# Patient Record
Sex: Female | Born: 1955 | Race: Black or African American | Hispanic: No | Marital: Single | Smoking: Never smoker
Health system: Southern US, Community
[De-identification: ages and names within clinical notes are randomized; demographics above are authoritative.]

## PROBLEM LIST (undated history)

## (undated) DIAGNOSIS — I10 Essential (primary) hypertension: Secondary | ICD-10-CM

## (undated) HISTORY — PX: COLONOSCOPY: SHX174

## (undated) HISTORY — DX: Essential (primary) hypertension: I10

---

## 1997-06-04 ENCOUNTER — Other Ambulatory Visit: Admission: RE | Admit: 1997-06-04 | Discharge: 1997-06-04 | Payer: Self-pay | Admitting: Obstetrics and Gynecology

## 1998-05-25 ENCOUNTER — Ambulatory Visit (HOSPITAL_COMMUNITY): Admission: RE | Admit: 1998-05-25 | Discharge: 1998-05-25 | Payer: Self-pay | Admitting: *Deleted

## 1998-05-25 ENCOUNTER — Encounter: Payer: Self-pay | Admitting: *Deleted

## 1998-11-19 ENCOUNTER — Other Ambulatory Visit: Admission: RE | Admit: 1998-11-19 | Discharge: 1998-11-19 | Payer: Self-pay | Admitting: Obstetrics and Gynecology

## 1999-01-28 ENCOUNTER — Encounter: Admission: RE | Admit: 1999-01-28 | Discharge: 1999-02-11 | Payer: Self-pay | Admitting: Internal Medicine

## 2000-04-06 ENCOUNTER — Other Ambulatory Visit: Admission: RE | Admit: 2000-04-06 | Discharge: 2000-04-06 | Payer: Self-pay | Admitting: Obstetrics and Gynecology

## 2001-05-17 ENCOUNTER — Other Ambulatory Visit: Admission: RE | Admit: 2001-05-17 | Discharge: 2001-05-17 | Payer: Self-pay | Admitting: Obstetrics and Gynecology

## 2001-10-04 ENCOUNTER — Encounter: Payer: Self-pay | Admitting: Family Medicine

## 2001-10-04 ENCOUNTER — Encounter: Admission: RE | Admit: 2001-10-04 | Discharge: 2001-10-04 | Payer: Self-pay | Admitting: Family Medicine

## 2002-05-30 ENCOUNTER — Other Ambulatory Visit: Admission: RE | Admit: 2002-05-30 | Discharge: 2002-05-30 | Payer: Self-pay | Admitting: Obstetrics and Gynecology

## 2011-07-31 ENCOUNTER — Other Ambulatory Visit: Payer: Self-pay | Admitting: Internal Medicine

## 2011-07-31 DIAGNOSIS — M545 Low back pain: Secondary | ICD-10-CM

## 2011-08-04 ENCOUNTER — Ambulatory Visit
Admission: RE | Admit: 2011-08-04 | Discharge: 2011-08-04 | Disposition: A | Discharge status: Home / Self Care | Payer: BC Managed Care – PPO | Source: Ambulatory Visit | Attending: Internal Medicine | Admitting: Internal Medicine

## 2011-08-04 DIAGNOSIS — M545 Low back pain: Secondary | ICD-10-CM

## 2011-08-14 ENCOUNTER — Other Ambulatory Visit: Payer: Self-pay | Admitting: Internal Medicine

## 2011-08-14 DIAGNOSIS — M5410 Radiculopathy, site unspecified: Secondary | ICD-10-CM

## 2011-08-21 ENCOUNTER — Ambulatory Visit
Admission: RE | Admit: 2011-08-21 | Discharge: 2011-08-21 | Disposition: A | Discharge status: Home / Self Care | Payer: BC Managed Care – PPO | Source: Ambulatory Visit | Attending: Internal Medicine | Admitting: Internal Medicine

## 2011-08-21 VITALS — BP 181/92 | HR 72 | Ht 64.0 in | Wt 200.0 lb

## 2011-08-21 DIAGNOSIS — M5410 Radiculopathy, site unspecified: Secondary | ICD-10-CM

## 2011-08-21 MED ORDER — IOHEXOL 180 MG/ML  SOLN
1.0000 mL | Freq: Once | INTRAMUSCULAR | Status: AC | PRN
  Administered 2011-08-21: 1 mL via EPIDURAL

## 2011-08-21 MED ORDER — METHYLPREDNISOLONE ACETATE 40 MG/ML INJ SUSP (RADIOLOG
120.0000 mg | Freq: Once | INTRAMUSCULAR | Status: AC
  Administered 2011-08-21: 120 mg via EPIDURAL

## 2011-09-14 ENCOUNTER — Other Ambulatory Visit: Payer: Self-pay | Admitting: Internal Medicine

## 2011-09-14 DIAGNOSIS — IMO0002 Reserved for concepts with insufficient information to code with codable children: Secondary | ICD-10-CM

## 2011-09-25 ENCOUNTER — Ambulatory Visit
Admission: RE | Admit: 2011-09-25 | Discharge: 2011-09-25 | Disposition: A | Discharge status: Home / Self Care | Payer: BC Managed Care – PPO | Source: Ambulatory Visit | Attending: Internal Medicine | Admitting: Internal Medicine

## 2011-09-25 VITALS — BP 163/89 | HR 63

## 2011-09-25 DIAGNOSIS — IMO0002 Reserved for concepts with insufficient information to code with codable children: Secondary | ICD-10-CM

## 2011-09-25 MED ORDER — METHYLPREDNISOLONE ACETATE 40 MG/ML INJ SUSP (RADIOLOG
120.0000 mg | Freq: Once | INTRAMUSCULAR | Status: AC
  Administered 2011-09-25: 120 mg via EPIDURAL

## 2011-09-25 MED ORDER — IOHEXOL 180 MG/ML  SOLN
1.0000 mL | Freq: Once | INTRAMUSCULAR | Status: AC | PRN
  Administered 2011-09-25: 1 mL via EPIDURAL

## 2012-02-09 ENCOUNTER — Encounter: Payer: Self-pay | Admitting: Gastroenterology

## 2012-03-04 ENCOUNTER — Ambulatory Visit (AMBULATORY_SURGERY_CENTER): Payer: BC Managed Care – PPO

## 2012-03-04 ENCOUNTER — Encounter: Payer: Self-pay | Admitting: Gastroenterology

## 2012-03-04 VITALS — Ht 64.0 in | Wt 206.0 lb

## 2012-03-04 DIAGNOSIS — Z1211 Encounter for screening for malignant neoplasm of colon: Secondary | ICD-10-CM

## 2012-03-04 MED ORDER — MOVIPREP 100 G PO SOLR: 1.0000 | Freq: Once | ORAL | Status: DC

## 2012-03-18 ENCOUNTER — Encounter: Payer: Self-pay | Admitting: Gastroenterology

## 2012-03-18 ENCOUNTER — Ambulatory Visit (AMBULATORY_SURGERY_CENTER): Payer: BC Managed Care – PPO | Admitting: Gastroenterology

## 2012-03-18 VITALS — BP 144/80 | HR 86 | Temp 97.6°F | Resp 27 | Ht 64.0 in | Wt 206.0 lb

## 2012-03-18 DIAGNOSIS — D126 Benign neoplasm of colon, unspecified: Secondary | ICD-10-CM

## 2012-03-18 DIAGNOSIS — Z1211 Encounter for screening for malignant neoplasm of colon: Secondary | ICD-10-CM

## 2012-03-18 MED ORDER — SODIUM CHLORIDE 0.9 % IV SOLN: 500.0000 mL | INTRAVENOUS | Status: DC

## 2012-03-18 NOTE — Progress Notes (Signed)
Called to room to assist during endoscopic procedure.  Patient ID and intended procedure confirmed with present staff. Received instructions for my participation in the procedure from the performing physician.  

## 2012-03-18 NOTE — Op Note (Signed)
[facility] [address] [place], [address]   COLONOSCOPY PROCEDURE REPORT PATIENT: Carla Owens, Carla Owens  MR#: [id number] BIRTHDATE: 1955-07-10 , 56  yrs. old GENDER: Female ENDOSCOPIST: Meryl Dare, MD, [facility] REFERRED XB:JYNWG Nicholos Johns, M.D. PROCEDURE DATE:  03/18/2012 PROCEDURE:   Colonoscopy with biopsy ASA CLASS:   Class II INDICATIONS:average risk screening. MEDICATIONS: MAC sedation, administered by CRNA and propofol (Diprivan) 230mg  IV DESCRIPTION OF PROCEDURE:   After the risks benefits and alternatives of the procedure were thoroughly explained, informed consent was obtained.  A digital rectal exam revealed no abnormalities of the rectum.   The LB [id number]  endoscope was introduced through the anus and advanced to the cecum, which was identified by both the appendix and ileocecal valve. No adverse events experienced.   The quality of the prep was excellent, using MoviPrep  The instrument was then slowly withdrawn as the colon was fully examined.  COLON FINDINGS: A sessile polyp measuring 4 mm in size was found in the transverse colon.  A polypectomy was performed with cold forceps.  The resection was complete and the polyp tissue was completely retrieved.   A sessile polyp measuring 5 mm in size was found in the descending colon.  A polypectomy was performed with cold forceps.  The resection was complete and the polyp tissue was completely retrieved.   Two sessile polyps measuring 5 mm in size were found in the sigmoid colon.  A polypectomy was performed with cold forceps.  The resection was complete and the polyp tissue was completely retrieved.   The colon was otherwise normal.  There was no diverticulosis, inflammation, polyps or cancers unless previously stated.  Retroflexed views revealed internal hemorrhoids. The time to cecum=1 minutes 20 seconds.  Withdrawal time=9 minutes 21 seconds.  The scope was withdrawn and the procedure  completed. COMPLICATIONS: There were no complications.  ENDOSCOPIC IMPRESSION: 1.   Sessile polyp measuring 4 mm in the transverse colon; polypectomy performed with cold forceps 2.   Sessile polyp measuring 5 mm in the descending colon; polypectomy performed with cold forceps 3.   Two sessile polyps measuring 5 mm in the sigmoid colon; polypectomy performed with cold forceps 4.   Small internal hemorrhoids  RECOMMENDATIONS: 1.  Await pathology results 2.  Repeat colonoscopy in 5 years if polyp(s) adenomatous; otherwise 10 years  eSigned:  Meryl Dare, MD, [facility] 03/18/2012 11:17 AM

## 2012-03-18 NOTE — Progress Notes (Signed)
Patient did not experience any of the following events: a burn prior to discharge; a fall within the facility; wrong site/side/patient/procedure/implant event; or a hospital transfer or hospital admission upon discharge from the facility. (G8907) Patient did not have preoperative order for IV antibiotic SSI prophylaxis. (G8918)  

## 2012-03-18 NOTE — Patient Instructions (Addendum)
Impressions/recommendations:  Multiple polyps (handout given) Hemorrhoids (handout given)  Repeat colonoscopy dependent upon pathology results  YOU HAD AN ENDOSCOPIC PROCEDURE TODAY AT THE [facility]: Refer to the procedure report that was given to you for any specific questions about what was found during the examination.  If the procedure report does not answer your questions, please call your gastroenterologist to clarify.  If you requested that your care partner not be given the details of your procedure findings, then the procedure report has been included in a sealed envelope for you to review at your convenience later.  YOU SHOULD EXPECT: Some feelings of bloating in the abdomen. Passage of more gas than usual.  Walking can help get rid of the air that was put into your GI tract during the procedure and reduce the bloating. If you had a lower endoscopy (such as a colonoscopy or flexible sigmoidoscopy) you may notice spotting of blood in your stool or on the toilet paper. If you underwent a bowel prep for your procedure, then you may not have a normal bowel movement for a few days.  DIET: Your first meal following the procedure should be a light meal and then it is ok to progress to your normal diet.  A half-sandwich or bowl of soup is an example of a good first meal.  Heavy or fried foods are harder to digest and may make you feel nauseous or bloated.  Likewise meals heavy in dairy and vegetables can cause extra gas to form and this can also increase the bloating.  Drink plenty of fluids but you should avoid alcoholic beverages for 24 hours.  ACTIVITY: Your care partner should take you home directly after the procedure.  You should plan to take it easy, moving slowly for the rest of the day.  You can resume normal activity the day after the procedure however you should NOT DRIVE or use heavy machinery for 24 hours (because of the sedation medicines used during the test).     SYMPTOMS TO REPORT IMMEDIATELY: A gastroenterologist can be reached at any hour.  During normal business hours, 8:30 AM to 5:00 PM Monday through Friday, call [phone].  After hours and on weekends, please call the GI answering service at [phone] who will take a message and have the physician on call contact you.   Following lower endoscopy (colonoscopy or flexible sigmoidoscopy):  Excessive amounts of blood in the stool  Significant tenderness or worsening of abdominal pains  Swelling of the abdomen that is new, acute  Fever of 100F or higher  FOLLOW UP: If any biopsies were taken you will be contacted by phone or by letter within the next 1-3 weeks.  Call your gastroenterologist if you have not heard about the biopsies in 3 weeks.  Our staff will call the home number listed on your records the next business day following your procedure to check on you and address any questions or concerns that you may have at that time regarding the information given to you following your procedure. This is a courtesy call and so if there is no answer at the home number and we have not heard from you through the emergency physician on call, we will assume that you have returned to your regular daily activities without incident.  SIGNATURES/CONFIDENTIALITY: You and/or your care partner have signed paperwork which will be entered into your electronic medical record.  These signatures attest to the fact that that the information above on your  After Visit Summary has been reviewed and is understood.  Full responsibility of the confidentiality of this discharge information lies with you and/or your care-partner.

## 2012-03-21 ENCOUNTER — Telehealth: Payer: Self-pay | Admitting: *Deleted

## 2012-03-21 NOTE — Telephone Encounter (Signed)
Left message on number given in admitting to return call if problems or questions. ewm

## 2012-03-23 ENCOUNTER — Encounter: Payer: Self-pay | Admitting: Gastroenterology

## 2015-11-05 ENCOUNTER — Ambulatory Visit (INDEPENDENT_AMBULATORY_CARE_PROVIDER_SITE_OTHER): Payer: BC Managed Care – PPO | Admitting: Podiatry

## 2015-11-05 ENCOUNTER — Other Ambulatory Visit: Payer: Self-pay | Admitting: *Deleted

## 2015-11-05 ENCOUNTER — Ambulatory Visit (INDEPENDENT_AMBULATORY_CARE_PROVIDER_SITE_OTHER): Payer: BC Managed Care – PPO

## 2015-11-05 ENCOUNTER — Encounter: Payer: Self-pay | Admitting: Podiatry

## 2015-11-05 ENCOUNTER — Ambulatory Visit: Payer: Self-pay

## 2015-11-05 DIAGNOSIS — M205X2 Other deformities of toe(s) (acquired), left foot: Secondary | ICD-10-CM

## 2015-11-05 DIAGNOSIS — L600 Ingrowing nail: Secondary | ICD-10-CM

## 2015-11-05 DIAGNOSIS — M722 Plantar fascial fibromatosis: Secondary | ICD-10-CM

## 2015-11-05 MED ORDER — METHYLPREDNISOLONE 4 MG PO TBPK: ORAL_TABLET | ORAL | 0 refills | Status: DC

## 2015-11-05 NOTE — Progress Notes (Signed)
   Subjective:    Patient ID: Carla Owens, female    DOB: Dec 07, 1955, 60 y.o.   MRN: [id number]  HPI: She presents today with chief complaint of plantar heels bilaterally right greater than left aching for the past several months mornings are particularly bad and she hasn't had treatment for these further past 4 years. She states that they recently started bothering her. She's also noted that she has a mallet toe deformity resulting in a corn to the distal aspect overlying the top of the toe she states is tender with shoes and she tried using Lott is a corn remover which took the corner way but also discolored her toe. She is also considering that she has ingrown toenails to the hallux bilaterally where she had matrixectomy performed but she still has to trim on them she says.    Review of Systems  Musculoskeletal: Positive for back pain and myalgias.  All other systems reviewed and are negative.      Objective:   Physical Exam: Vital signs are stable she is alert and oriented 3. Pulses are palpable. Neurologic sensorium is intact. Deep tendon reflexes are intact. Muscle strength is intact and symmetrical bilateral. Orthopedic evaluation and stress rectus foot type bilateral. She has pain on palpation medial calcaneal tubercles bilaterally. Mallet toe deformity second left greater than that of the right and is flexible in nature. Cutaneous evaluation demonstrates normal supple well-hydrated cutis she does have slight reactive hyperkeratotic lesion overlying the DIPJ second digit left foot and she does have some sharp incurvated nail margins to the tibiofibular hallux bilateral.        Assessment & Plan:  Plantar fasciitis bilateral. Ingrown toenails hallux bilateral. 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  Plan: I injected the bilateral heels and put her in bilateral plantar fascia braces today she has her night splint at home which she will utilize. We discussed appropriate shoe gear  stretching exercises and ice therapy. Start her on a Medrol Dosepak. Provided her with icing and stretching instructions will follow up with her in 1 month.

## 2015-11-05 NOTE — Patient Instructions (Signed)

## 2015-12-03 ENCOUNTER — Ambulatory Visit: Payer: BC Managed Care – PPO | Admitting: Podiatry

## 2015-12-05 ENCOUNTER — Ambulatory Visit: Payer: BC Managed Care – PPO | Admitting: Podiatry

## 2015-12-17 ENCOUNTER — Encounter: Payer: Self-pay | Admitting: Podiatry

## 2015-12-17 ENCOUNTER — Ambulatory Visit (INDEPENDENT_AMBULATORY_CARE_PROVIDER_SITE_OTHER): Payer: BC Managed Care – PPO | Admitting: Podiatry

## 2015-12-17 DIAGNOSIS — L600 Ingrowing nail: Secondary | ICD-10-CM | POA: Diagnosis not present

## 2015-12-17 MED ORDER — NEOMYCIN-POLYMYXIN-HC 1 % OT SOLN: OTIC | 1 refills | Status: DC

## 2015-12-17 NOTE — Progress Notes (Signed)
She presents today for follow-up of her bilateral plantar fasciitis that she is a proximally 70-75% improved. She would like to have her nail procedures performed today to both ingrown nails of the hallux bilaterally.  Objective: Vital signs are stable alert and oriented 3 pulses are palpable. Neurologic sensorium is intact. Much decrease in pain on palpation medial calcaneal tubercles bilateral. Sharp incurvated nail margins along the tibiofibular border of the hallux bilaterally left greater than right the left great toe has been corrected before.  Assessment: Resolving plantar fasciitis 70-75% improved. Ingrown toenails tibial and fibular border of the hallux bilateral.  Plan: Chemical matrixectomy was performed to the hallux bilateral today after local anesthesia was administered. She tolerated the procedure well and was provided with both oral and written home going instructions for care and soaking of her toe as well as a prescription for Cortisporin Otic to be applied twice daily after soaking. I will follow-up with her in 1 week or so at which time we may consider reinjecting her bilateral heels of the worsened. I will follow-up with her in 1-2 weeks or she will call with questions or concerns.

## 2015-12-17 NOTE — Patient Instructions (Signed)

## 2015-12-31 ENCOUNTER — Ambulatory Visit (INDEPENDENT_AMBULATORY_CARE_PROVIDER_SITE_OTHER): Payer: BC Managed Care – PPO | Admitting: Podiatry

## 2015-12-31 ENCOUNTER — Encounter: Payer: Self-pay | Admitting: Podiatry

## 2015-12-31 DIAGNOSIS — L600 Ingrowing nail: Secondary | ICD-10-CM | POA: Diagnosis not present

## 2015-12-31 NOTE — Patient Instructions (Signed)

## 2016-01-01 NOTE — Progress Notes (Signed)
She presents today for follow-up of matrixectomy to the hallux bilateral. She states that she continues to soak in Epsom salts and warm water and the toe seemed to be doing pretty well. She is also concerned about pain to the distal aspect of the second digit left foot. She states it is painful and she would like to consider having this surgically fixed.  Objective: Vital signs are stable she is alert and oriented 3. Pulses are palpable. Neurologic sensorium is intact. Degenerative flexors are intact. Surgical toe appears to be healing quite nicely tibial and fibular borders. No granulation tissue noted. No malodor. She has a rigid DIPJ flexor contraction second digit left foot with overlying reactive hyperkeratosis. Radiographs reviewed and demonstrate mallet toe deformity.  Assessment: Well-healing surgical toes hallux bilateral. 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  Plan: I encouraged her to continue to soak Epsom salts and warm water until all the redness and tenderness has resolved. We also went over a consent form today  Number number given time to ask questions she soft. Regarding a DIPJ arthroplasty/mallet toe repair second digit left foot. I answered all the questions regarding this procedure is the best viability in layman's terms. We did discuss the possible postop complications which may include but are not limited to postop pain bleeding swelling infection recurrence and need for further surgery over correction and correction. Follow up with me in the next few weeks for surgical intervention.

## 2016-02-06 ENCOUNTER — Telehealth: Payer: Self-pay | Admitting: *Deleted

## 2016-02-06 NOTE — Telephone Encounter (Signed)
"  I received an email from the surgical center but I lost it.  Can you send it to me again?"  I do not know anything about it, you will have to call the surgical center.  Their number is [phone].

## 2016-03-05 ENCOUNTER — Telehealth: Payer: Self-pay | Admitting: *Deleted

## 2016-03-05 NOTE — Telephone Encounter (Signed)
"  I am scheduled for surgery on February 23.  Will my surgery be in the morning?  I haven't received a call about the time yet."  Your surgery will be in the morning.  "Can you give me a time because my daughter is taking off work to take me and she wants to schedule an appointment for herself as well that day while she's off."  I can't give you an exact time but I can tell you it will be that morning so if she wants to schedule she can do it that afternoon.  The reason why I can't give you a time is because there may be cancellations or a diabetic patient or child needs to go first so they switch people around.  "Oh okay, knowing that it will be that morning helps.  I will let my daughter know."

## 2016-03-19 ENCOUNTER — Other Ambulatory Visit: Payer: Self-pay | Admitting: Podiatry

## 2016-03-19 MED ORDER — CEPHALEXIN 500 MG PO CAPS: 500.0000 mg | ORAL_CAPSULE | Freq: Three times a day (TID) | ORAL | 0 refills | Status: DC

## 2016-03-19 MED ORDER — PROMETHAZINE HCL 25 MG PO TABS: 25.0000 mg | ORAL_TABLET | Freq: Three times a day (TID) | ORAL | 0 refills | Status: DC | PRN

## 2016-03-19 MED ORDER — OXYCODONE-ACETAMINOPHEN 10-325 MG PO TABS: 1.0000 | ORAL_TABLET | Freq: Four times a day (QID) | ORAL | 0 refills | Status: DC | PRN

## 2016-03-20 ENCOUNTER — Encounter: Payer: Self-pay | Admitting: Podiatry

## 2016-03-20 DIAGNOSIS — M2042 Other hammer toe(s) (acquired), left foot: Secondary | ICD-10-CM | POA: Diagnosis not present

## 2016-03-23 ENCOUNTER — Telehealth: Payer: Self-pay | Admitting: *Deleted

## 2016-03-23 NOTE — Telephone Encounter (Addendum)
Pt states she needs a note stating she is to be out of work from 03/20/2016 - 04/03/2016, and she would like to go back to work 1/2 days next week. I told pt is would need to inform Dr. Milinda Pointer for his approval. 03/25/2016-I spoke with pt and informed Dr.Hyatt had okayed her request to return to 1/2 days at work on 03/30/2016 - 04/03/2016, and to see how she feels prior to returning to work full time and if any restrictions. Pt states she would like to have the letter faxed to [phone] Attn: Dr. David Stall, and Dr. Maudie Mercury Sexton-Lewter. Faxed letter to Dr. Weldon Picking, and Dr. Jodell Cipro.

## 2016-03-23 NOTE — Telephone Encounter (Signed)
"  Can you call me back?  I had my surgery Friday, February 23 with Dr. Milinda Pointer.  I forgot to get a doctor's note for me being out of work.  I just need your help in making sure that my director gets a doctor"s note.  I want to talk to you about getting a doctor's note for my job.  Thank you."  I am returning your call how can I help you?  "I had surgery on Friday.  I'm glad I listened to you and took 10 days off.  After that numbness wore off I felt it.  I been keeping it elevated and icing it like you told me.  I forgot to get a note for work.  Can you send a letter to my job?"  I am glad you are doing well.  You need to speak to [place].  She takes care of FMLA / Short term disability.  She is not available at this moment but I will get her to give you a call.

## 2016-03-25 ENCOUNTER — Encounter: Payer: Self-pay | Admitting: *Deleted

## 2016-03-25 NOTE — Telephone Encounter (Signed)
That should be fine. 

## 2016-03-26 ENCOUNTER — Ambulatory Visit (INDEPENDENT_AMBULATORY_CARE_PROVIDER_SITE_OTHER): Payer: Self-pay | Admitting: Podiatry

## 2016-03-26 ENCOUNTER — Ambulatory Visit (INDEPENDENT_AMBULATORY_CARE_PROVIDER_SITE_OTHER): Payer: BC Managed Care – PPO

## 2016-03-26 VITALS — BP 103/66 | HR 83 | Temp 97.0°F

## 2016-03-26 DIAGNOSIS — M205X2 Other deformities of toe(s) (acquired), left foot: Secondary | ICD-10-CM

## 2016-03-26 DIAGNOSIS — Z9889 Other specified postprocedural states: Secondary | ICD-10-CM

## 2016-03-26 NOTE — Progress Notes (Signed)
She presents today for her first postop visit she is status post mallet toe repair DIPJ arthroplasty second digit left foot denies fever chills nausea vomiting muscle aches and pains.  Objective: Vital signs are stable alert and oriented 3. Pulses are palpable. Neurologic sensorium is intact. Degenerative flexor intact. Dry sterile dressing intact was removed demonstrates rectus toe sutures are intact towards a well coapted radiographs demonstrate complete arthroplasty the DIPJ.  Assessment: Well-healing surgical foot second digit left foot.  Plan: Dry sterile compressive dressing was applied today she will continue to keep this dry and utilize her Darco shoe I will follow-up with her in 1 week.

## 2016-04-02 ENCOUNTER — Ambulatory Visit: Payer: BC Managed Care – PPO | Admitting: Podiatry

## 2016-04-02 ENCOUNTER — Ambulatory Visit (INDEPENDENT_AMBULATORY_CARE_PROVIDER_SITE_OTHER): Payer: BC Managed Care – PPO | Admitting: Podiatry

## 2016-04-02 DIAGNOSIS — M205X2 Other deformities of toe(s) (acquired), left foot: Secondary | ICD-10-CM

## 2016-04-06 NOTE — Progress Notes (Signed)
She presents today for a suture removal is post mallet toe repair second digit left foot date of surgery 03/20/2016. States that she's been doing very well and complications.  Objective: Dry sterile dressing attack was removed demonstrates sutures are intact margins well coapted and sutures were removed today margins remaining well with no signs of infection or swelling toe was in good position.  Assessment: Well healing surgical toe hallux left.  Plan: Sutures removed today encourage her to continue to wrap the toe with coban which I did demonstrate to her. I will follow-up with her in 2 weeks.

## 2016-04-21 ENCOUNTER — Ambulatory Visit (INDEPENDENT_AMBULATORY_CARE_PROVIDER_SITE_OTHER): Payer: BC Managed Care – PPO

## 2016-04-21 ENCOUNTER — Ambulatory Visit (INDEPENDENT_AMBULATORY_CARE_PROVIDER_SITE_OTHER): Payer: BC Managed Care – PPO | Admitting: Podiatry

## 2016-04-21 ENCOUNTER — Encounter: Payer: Self-pay | Admitting: Podiatry

## 2016-04-21 DIAGNOSIS — M779 Enthesopathy, unspecified: Secondary | ICD-10-CM

## 2016-04-21 DIAGNOSIS — M205X2 Other deformities of toe(s) (acquired), left foot: Secondary | ICD-10-CM

## 2016-04-21 DIAGNOSIS — M7752 Other enthesopathy of left foot: Secondary | ICD-10-CM

## 2016-04-21 DIAGNOSIS — M778 Other enthesopathies, not elsewhere classified: Secondary | ICD-10-CM

## 2016-04-21 MED ORDER — METHYLPREDNISOLONE 4 MG PO TBPK: ORAL_TABLET | ORAL | 0 refills | Status: DC

## 2016-04-22 NOTE — Progress Notes (Signed)
She presents today 1 month status post mallet toe repair second digit left foot. She states it is hurting all the way up to my leg and she refers to pain from the second metatarsal phalangeal joint the radiates of the anterior and then to the lateral aspect of the leg and in the posterior aspect of her thigh. She does relate a chronic history of sciatica.  Objective: Vital signs are stable she is alert and oriented 3. Pulses are palpable. Neurologic sensorium is intact. Degenerative flexors are intact. She has no pain on palpation of the surgical DIPJ arthroplasty however she does have some tenderness on deep palpation of the second metatarsophalangeal joint but not excruciating. She has more tenderness when she attempts to raise her leg or rotate the leg itself she states that it feels like it starts from second knuckle and radiates proximally. I see no signs of infection radiographs taken today demonstrate complete arthroplasty.  Assessment: Capsulitis and sciatica left.  Plan: I injected periarticular today Kenalog and local anesthetic. I gave the anesthetic time to take hold but the patient continued to have pain with leg range of motion. She had no pain on palpation of the second metatarsophalangeal joint at that time. This is consistent with proximal nerve injury or pain. I also recommended that she discontinue use of the Darco shoe and an attempt to level or back also going to start her on a Medrol Dosepak.

## 2016-05-12 ENCOUNTER — Ambulatory Visit (INDEPENDENT_AMBULATORY_CARE_PROVIDER_SITE_OTHER): Payer: Self-pay | Admitting: Podiatry

## 2016-05-12 ENCOUNTER — Ambulatory Visit (INDEPENDENT_AMBULATORY_CARE_PROVIDER_SITE_OTHER): Payer: BC Managed Care – PPO

## 2016-05-12 DIAGNOSIS — M205X2 Other deformities of toe(s) (acquired), left foot: Secondary | ICD-10-CM | POA: Diagnosis not present

## 2016-05-12 NOTE — Progress Notes (Signed)
She presents today for her final postop visit date of surgery 03/20/2016 mallet toe repair second digit left foot states that is doing great. She states that she still has some sciatic pain and lower back pain this radiating into her leg.  Objective: Pulses are palpable toe appears to be healing uneventfully. There is some scar tissue dorsal and medial at the DIPJ. We'll more than likely go ahead and resolve in the near future. Radiographs taken today demonstrate complete arthroplasty DIPJ second digit left foot no spurring noted.  Assessment: Healing surgical foot.  Plan: Recommended that she continue massage therapy to the second toe. I also recommended that she consult with her primary care provider for a neurosurgery consult.

## 2016-07-02 NOTE — Progress Notes (Signed)
1. Mallet toe repair 2nd toe left foot

## 2016-08-17 ENCOUNTER — Other Ambulatory Visit: Payer: Self-pay | Admitting: Internal Medicine

## 2016-08-17 DIAGNOSIS — M79662 Pain in left lower leg: Secondary | ICD-10-CM

## 2016-08-24 ENCOUNTER — Other Ambulatory Visit: Payer: BC Managed Care – PPO

## 2016-09-03 ENCOUNTER — Ambulatory Visit
Admission: RE | Admit: 2016-09-03 | Discharge: 2016-09-03 | Disposition: A | Discharge status: Home / Self Care | Payer: BC Managed Care – PPO | Source: Ambulatory Visit | Attending: Internal Medicine | Admitting: Internal Medicine

## 2016-09-03 DIAGNOSIS — M79662 Pain in left lower leg: Secondary | ICD-10-CM

## 2018-08-24 ENCOUNTER — Other Ambulatory Visit: Payer: Self-pay

## 2018-08-24 DIAGNOSIS — Z20822 Contact with and (suspected) exposure to covid-19: Secondary | ICD-10-CM

## 2018-08-26 LAB — NOVEL CORONAVIRUS, NAA: SARS-CoV-2, NAA: NOT DETECTED

## 2019-02-06 ENCOUNTER — Other Ambulatory Visit: Payer: Self-pay | Admitting: Cardiology

## 2019-02-06 DIAGNOSIS — Z20822 Contact with and (suspected) exposure to covid-19: Secondary | ICD-10-CM

## 2019-02-08 LAB — NOVEL CORONAVIRUS, NAA: SARS-CoV-2, NAA: NOT DETECTED

## 2019-04-18 ENCOUNTER — Other Ambulatory Visit: Payer: Self-pay | Admitting: Internal Medicine

## 2019-04-18 DIAGNOSIS — M5416 Radiculopathy, lumbar region: Secondary | ICD-10-CM

## 2019-05-19 ENCOUNTER — Ambulatory Visit
Admission: RE | Admit: 2019-05-19 | Discharge: 2019-05-19 | Disposition: A | Discharge status: Home / Self Care | Payer: BC Managed Care – PPO | Source: Ambulatory Visit | Attending: Internal Medicine | Admitting: Internal Medicine

## 2019-05-19 ENCOUNTER — Other Ambulatory Visit: Payer: Self-pay

## 2019-05-19 DIAGNOSIS — M5416 Radiculopathy, lumbar region: Secondary | ICD-10-CM

## 2020-03-22 DIAGNOSIS — E669 Obesity, unspecified: Secondary | ICD-10-CM | POA: Insufficient documentation

## 2020-03-22 DIAGNOSIS — I10 Essential (primary) hypertension: Secondary | ICD-10-CM | POA: Insufficient documentation

## 2020-03-22 DIAGNOSIS — D259 Leiomyoma of uterus, unspecified: Secondary | ICD-10-CM | POA: Insufficient documentation

## 2020-03-22 DIAGNOSIS — A6000 Herpesviral infection of urogenital system, unspecified: Secondary | ICD-10-CM | POA: Insufficient documentation

## 2020-03-22 DIAGNOSIS — G43909 Migraine, unspecified, not intractable, without status migrainosus: Secondary | ICD-10-CM | POA: Insufficient documentation

## 2021-02-08 IMAGING — MR MR LUMBAR SPINE W/O CM
4 of 9 series · 14 of 48 positions shown · non-contrast
Comparison: MRI 08/04/2011

CLINICAL DATA: Chronic low back pain, bilateral leg numbness

EXAM:
MRI LUMBAR SPINE WITHOUT CONTRAST
TECHNIQUE: Multiplanar, multisequence MR imaging of the lumbar spine was
performed. No intravenous contrast was administered.

[Series 5: T2 · sagittal · 4.0mm · 0.73mm/px · 3 of 15 slices shown (1 of 4)]
[im 1/15]
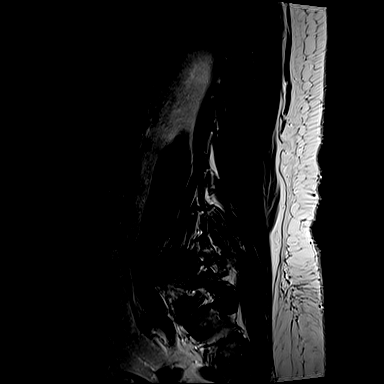
[im 8/15]
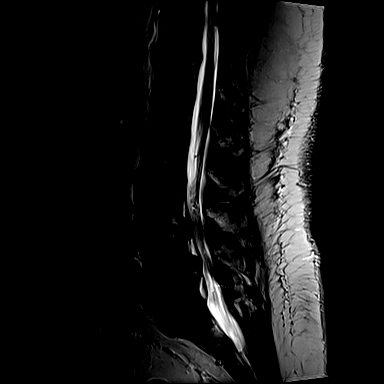
[im 15/15]
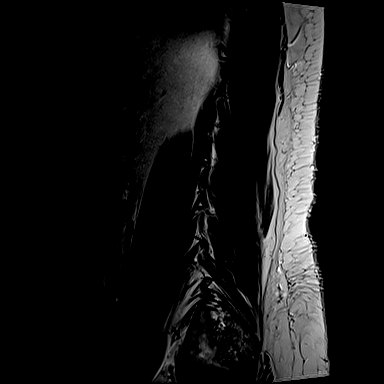

[Series 11: T2 · axial · 4.0mm · 0.28mm/px · z∈[+2,+92]mm · 5 of 19 slices shown (2 of 4)]
[im 1/19]
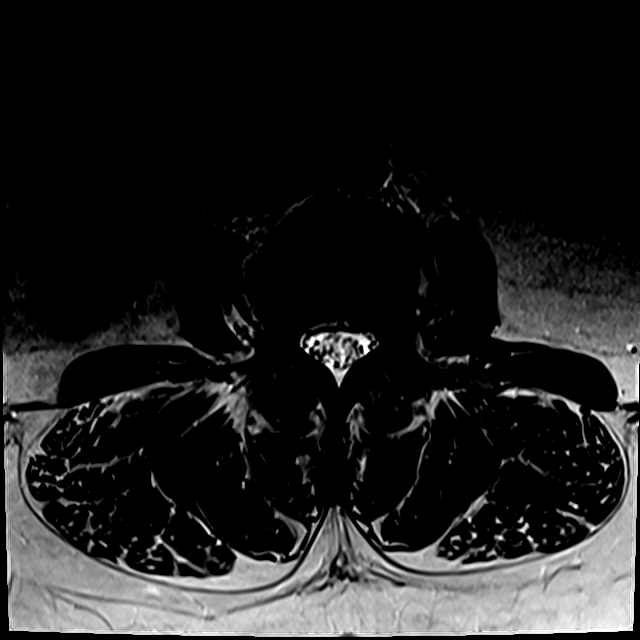
[im 5/19]
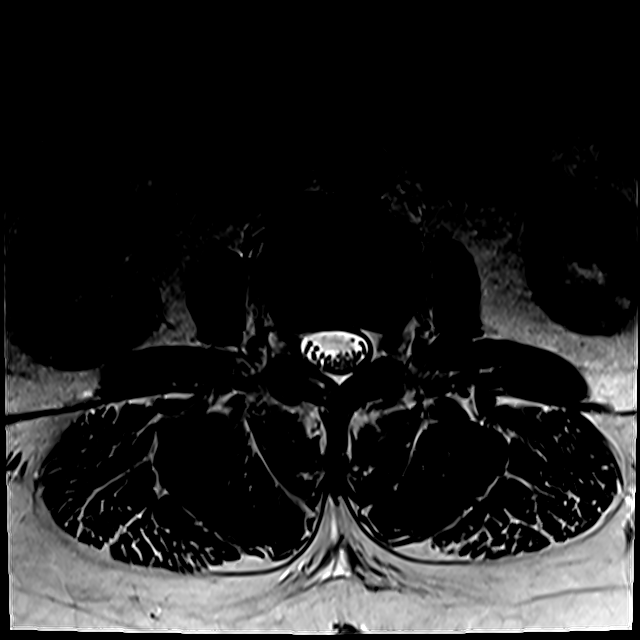
[im 10/19]
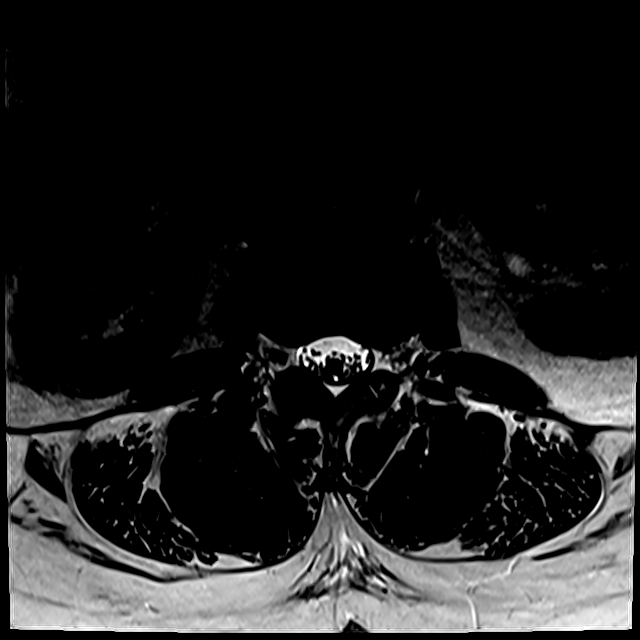
[im 14/19]
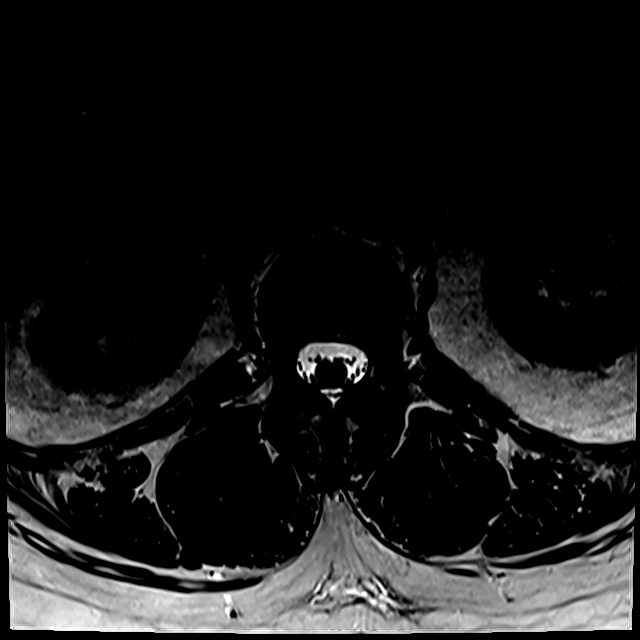
[im 19/19]
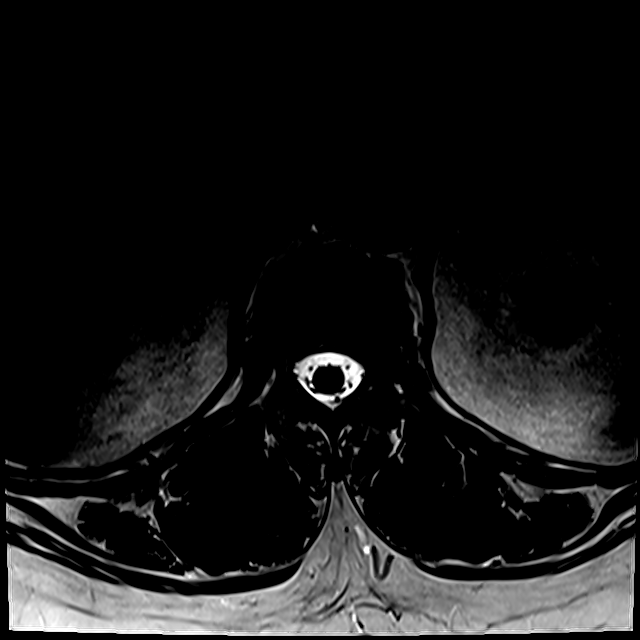

[Series 12: T2 · axial · 4.0mm · 0.28mm/px · z∈[-113,-19]mm · 3 of 20 slices shown (3 of 4)]
[im 1/20]
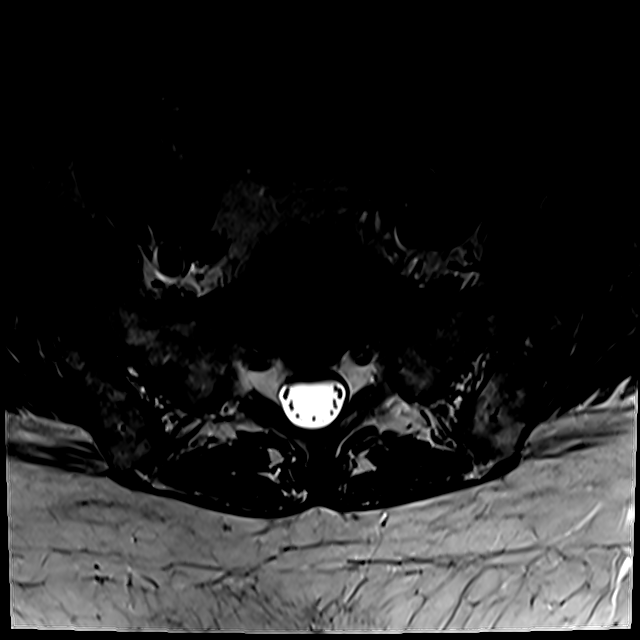
[im 10/20]
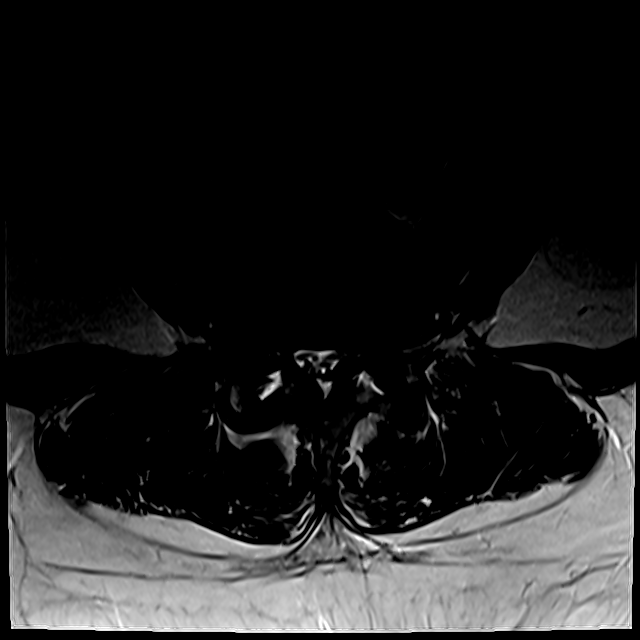
[im 20/20]
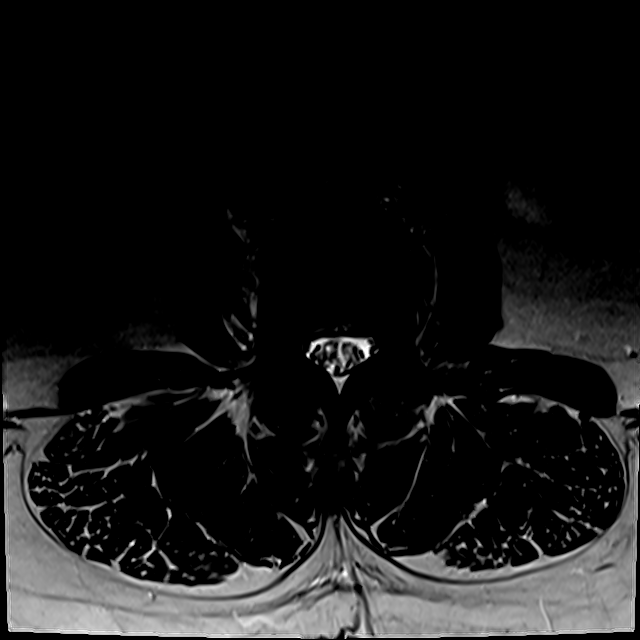

[Series 13: T2 · axial · 4.0mm · 0.28mm/px · z∈[-93,+67]mm · 3 of 39 slices shown (4 of 4)]
[im 5/39]
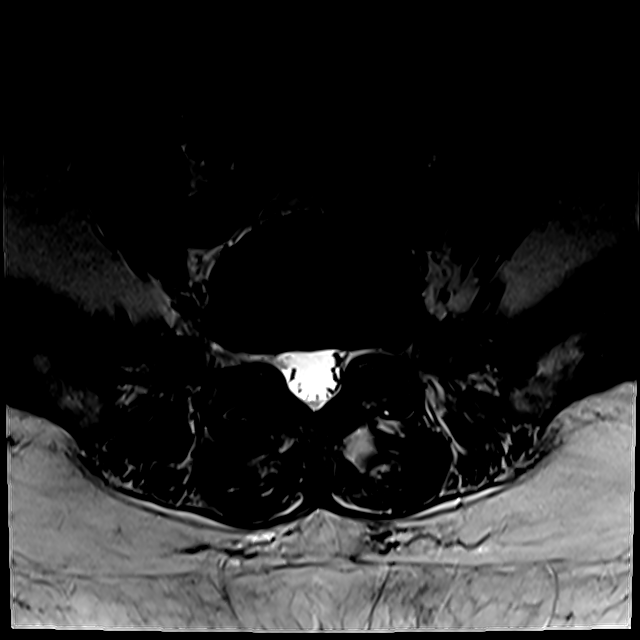
[im 20/39]
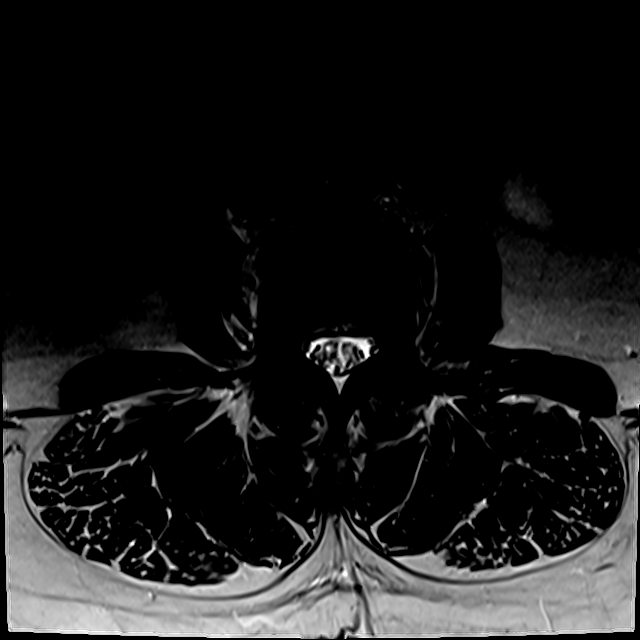
[im 34/39]
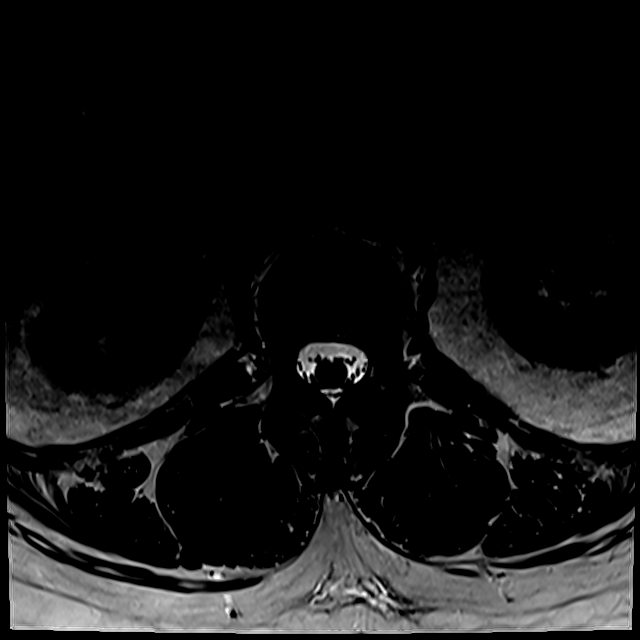

[14 of 48 positions shown; findings below may reference images not displayed]

FINDINGS: Segmentation:  Standard.

Alignment: 5 mm grade 1 anterolisthesis L3 on L4, progressed from
prior. 5 mm anterolisthesis L4 on L5, slightly progressed from
prior.

Vertebrae: No fracture, evidence of discitis, or bone lesion. Mild
discogenic endplate marrow changes including Schmorl's node the L4
superior endplate.

Conus medullaris and cauda equina: Conus extends to the L2 level.
Conus and cauda equina appear normal.

Paraspinal and other soft tissues: Cortically based T2 hyperintense
lesionswithin the bilateral kidneys, incompletely characterized, but
most likely represent cysts.

Disc levels:

T12-L1: No significant disc protrusion, foraminal stenosis, or canal
stenosis.

L1-L2: Mild right greater than left facet arthropathy. Unremarkable
disc. No foraminal or canal stenosis.

L2-L3: Mild bilateral facet arthropathy with ligamentum flavum
buckling. Small left foraminal disc protrusion. No foraminal or
canal stenosis.

L3-L4: Disc uncovering with diffuse disc bulge, moderate bilateral
facet arthrosis, and ligamentum flavum buckling. Findings contribute
to moderate to severe canal stenosis with moderate to severe right
and mild left foraminal stenosis. There is moderate right greater
than left subarticular recess stenosis. Findings have slightly
progressed from prior.

L4-L5: Disc uncovering with diffuse disc bulge, advanced bilateral
facet arthrosis, and ligamentum flavum buckling. Findings contribute
to moderate canal stenosis with moderate to severe bilateral
foraminal stenosis and moderate bilateral subarticular recess
stenosis. Findings slightly progressed from prior.

L5-S1: Minimal diffuse disc bulge and mild bilateral facet
hypertrophy resulting in moderate left and mild right foraminal
stenosis. No canal stenosis. Findings progressed from prior.
IMPRESSION: 1. Multilevel degenerative changes of the lumbar spine as described
above, progressed from prior.
2. Moderate-to-severe L3-4 and moderate L4-L5 canal stenosis.
3. Moderate-to-severe bilateral foraminal stenosis at L4-5 and
moderate-to-severe right foraminal stenosis at L3-4.

## 2021-11-24 ENCOUNTER — Other Ambulatory Visit: Payer: Self-pay | Admitting: Radiology

## 2022-06-23 ENCOUNTER — Encounter: Payer: Self-pay | Admitting: Gastroenterology

## 2022-07-02 ENCOUNTER — Encounter: Payer: Self-pay | Admitting: Gastroenterology

## 2022-07-02 ENCOUNTER — Ambulatory Visit: Payer: BC Managed Care – PPO

## 2022-07-02 VITALS — Ht 64.0 in | Wt 200.0 lb

## 2022-07-02 DIAGNOSIS — Z1211 Encounter for screening for malignant neoplasm of colon: Secondary | ICD-10-CM

## 2022-07-02 MED ORDER — NA SULFATE-K SULFATE-MG SULF 17.5-3.13-1.6 GM/177ML PO SOLN: 1.0000 | Freq: Once | ORAL | 0 refills | Status: AC

## 2022-07-02 NOTE — Progress Notes (Signed)

## 2022-07-23 ENCOUNTER — Encounter: Payer: BC Managed Care – PPO | Admitting: Gastroenterology

## 2022-07-23 ENCOUNTER — Ambulatory Visit (AMBULATORY_SURGERY_CENTER): Payer: BC Managed Care – PPO | Admitting: Gastroenterology

## 2022-07-23 ENCOUNTER — Encounter: Payer: Self-pay | Admitting: Gastroenterology

## 2022-07-23 VITALS — BP 117/73 | HR 65 | Temp 97.1°F | Resp 11 | Ht 64.0 in | Wt 200.0 lb

## 2022-07-23 DIAGNOSIS — D122 Benign neoplasm of ascending colon: Secondary | ICD-10-CM | POA: Diagnosis not present

## 2022-07-23 DIAGNOSIS — D123 Benign neoplasm of transverse colon: Secondary | ICD-10-CM | POA: Diagnosis not present

## 2022-07-23 DIAGNOSIS — K635 Polyp of colon: Secondary | ICD-10-CM | POA: Diagnosis not present

## 2022-07-23 DIAGNOSIS — D125 Benign neoplasm of sigmoid colon: Secondary | ICD-10-CM

## 2022-07-23 DIAGNOSIS — Z1211 Encounter for screening for malignant neoplasm of colon: Secondary | ICD-10-CM

## 2022-07-23 MED ORDER — SODIUM CHLORIDE 0.9 % IV SOLN: 500.0000 mL | Freq: Once | INTRAVENOUS | Status: DC

## 2022-07-23 NOTE — Progress Notes (Signed)
Report to PACU, RN, vss, BBS= Clear.  

## 2022-07-23 NOTE — Progress Notes (Signed)
Called to room to assist during endoscopic procedure.  Patient ID and intended procedure confirmed with present staff. Received instructions for my participation in the procedure from the performing physician.  

## 2022-07-23 NOTE — Op Note (Signed)
[facility] Patient Name: Carla Owens Procedure Date: 07/23/2022 3:20 PM MRN: [id number] Endoscopist: Meryl Dare , MD, [phone] Age: 67 Referring MD:  Date of Birth: 21-Mar-1955 Gender: Female Account #: [id number] Procedure:                Colonoscopy Indications:              Screening for colorectal malignant neoplasm Medicines:                Monitored Anesthesia Care Procedure:                Pre-Anesthesia Assessment:                           - Prior to the procedure, a History and Physical                            was performed, and patient medications and                            allergies were reviewed. The patient's tolerance of                            previous anesthesia was also reviewed. The risks                            and benefits of the procedure and the sedation                            options and risks were discussed with the patient.                            All questions were answered, and informed consent                            was obtained. Prior Anticoagulants: The patient has                            taken no anticoagulant or antiplatelet agents. ASA                            Grade Assessment: II - A patient with mild systemic                            disease. After reviewing the risks and benefits,                            the patient was deemed in satisfactory condition to                            undergo the procedure.                           After obtaining informed consent, the colonoscope  was passed under direct vision. Throughout the                            procedure, the patient's blood pressure, pulse, and                            oxygen saturations were monitored continuously. The                            CF [id number] #[id number] was introduced through the anus                            and advanced to the the cecum, identified by                            appendiceal orifice  and ileocecal valve. The                            ileocecal valve, appendiceal orifice, and rectum                            were photographed. The quality of the bowel                            preparation was good. The colonoscopy was performed                            without difficulty. The patient tolerated the                            procedure well. Scope In: 3:25:50 PM Scope Out: 3:45:32 PM Scope Withdrawal Time: 0 hours 18 minutes 21 seconds  Total Procedure Duration: 0 hours 19 minutes 42 seconds  Findings:                 The perianal and digital rectal examinations were                            normal.                           Eight sessile polyps were found in the sigmoid                            colon (1), transverse colon (3), hepatic flexure                            (2) and ascending colon (2). The polyps were 5 to 9                            mm in size. These polyps were removed with a cold                            snare. Resection and retrieval were complete.  External hemorrhoids were found during                            retroflexion. The hemorrhoids were small.                           The exam was otherwise without abnormality on                            direct and retroflexion views. Complications:            No immediate complications. Estimated blood loss:                            None. Estimated Blood Loss:     Estimated blood loss: none. Impression:               - Eight 5 to 9 mm polyps in the sigmoid colon, in                            the transverse colon, at the hepatic flexure and in                            the ascending colon, removed with a cold snare.                            Resected and retrieved.                           - External hemorrhoids.                           - The examination was otherwise normal on direct                            and retroflexion views. Recommendation:            - Repeat colonoscopy, likely 3 years, after studies                            are complete for surveillance based on pathology                            results.                           - Patient has a contact number available for                            emergencies. The signs and symptoms of potential                            delayed complications were discussed with the                            patient. Return to normal activities tomorrow.  Written discharge instructions were provided to the                            patient.                           - Resume previous diet.                           - Continue present medications.                           - Await pathology results. Meryl Dare, MD 07/23/2022 3:49:35 PM This report has been signed electronically.

## 2022-07-23 NOTE — Progress Notes (Signed)
History & Physical  Primary Care Physician:  Obgyn, Ma Hillock Primary Gastroenterologist: Claudette Head, MD  Impression / Plan:  CRC screening, average risk for colonoscopy  CHIEF COMPLAINT:  CRC screening   HPI: Carla Owens is a 67 y.o. female CRC screening, average risk for colonoscopy.    Past Medical History:  Diagnosis Date   Hypertension     Past Surgical History:  Procedure Laterality Date   COLONOSCOPY     2008    Prior to Admission medications   Medication Sig Start Date End Date Taking? Authorizing Provider  cephALEXin (KEFLEX) 500 MG capsule Take 1 capsule (500 mg total) by mouth 3 (three) times daily. Patient not taking: Reported on 07/02/2022 03/19/16   Elinor Parkinson, DPM  Doxepin HCl 3 MG TABS Take 1 tablet by mouth at bedtime. 05/03/22   [provider]  fluticasone Aleda Grana) 50 MCG/ACT nasal spray  09/15/15   [provider]  hydrOXYzine (ATARAX/VISTARIL) 25 MG tablet Take 25 mg by mouth at bedtime.    [provider]  ibuprofen (ADVIL,MOTRIN) 800 MG tablet Take 800 mg by mouth every 8 (eight) hours as needed.    [provider]  irbesartan-hydrochlorothiazide (AVALIDE) 150-12.5 MG tablet  10/18/15   [provider]  levocetirizine (XYZAL) 5 MG tablet  10/18/15   [provider]  NEOMYCIN-POLYMYXIN-HYDROCORTISONE (CORTISPORIN) 1 % SOLN otic solution Apply 1-2 drops to toe BID after soaking Patient not taking: Reported on 07/02/2022 12/17/15   Ernestene Kiel T, DPM  nystatin ointment (MYCOSTATIN)  08/16/15   [provider]  oxyCODONE-acetaminophen (PERCOCET) 10-325 MG tablet Take 1 tablet by mouth every 6 (six) hours as needed for pain. 03/19/16   Hyatt, Max T, DPM  pregabalin (LYRICA) 75 MG capsule Take 75 mg by mouth 2 (two) times daily. 06/04/22   [provider]  promethazine (PHENERGAN) 25 MG tablet Take 1 tablet (25 mg total) by mouth every 8 (eight) hours as needed. Patient not taking: Reported on  07/02/2022 03/19/16   Ernestene Kiel T, DPM  ranitidine (ZANTAC) 150 MG tablet Take 150 mg by mouth 2 (two) times daily. Patient not taking: Reported on 07/02/2022    [provider]  triamcinolone ointment (KENALOG) 0.1 %  08/18/15   [provider]    Current Outpatient Medications  Medication Sig Dispense Refill   cephALEXin (KEFLEX) 500 MG capsule Take 1 capsule (500 mg total) by mouth 3 (three) times daily. (Patient not taking: Reported on 07/02/2022) 30 capsule 0   Doxepin HCl 3 MG TABS Take 1 tablet by mouth at bedtime.     fluticasone (FLONASE) 50 MCG/ACT nasal spray      hydrOXYzine (ATARAX/VISTARIL) 25 MG tablet Take 25 mg by mouth at bedtime.     ibuprofen (ADVIL,MOTRIN) 800 MG tablet Take 800 mg by mouth every 8 (eight) hours as needed.     irbesartan-hydrochlorothiazide (AVALIDE) 150-12.5 MG tablet      levocetirizine (XYZAL) 5 MG tablet      NEOMYCIN-POLYMYXIN-HYDROCORTISONE (CORTISPORIN) 1 % SOLN otic solution Apply 1-2 drops to toe BID after soaking (Patient not taking: Reported on 07/02/2022) 10 mL 1   nystatin ointment (MYCOSTATIN)  (Patient not taking: Reported on 07/02/2022)     oxyCODONE-acetaminophen (PERCOCET) 10-325 MG tablet Take 1 tablet by mouth every 6 (six) hours as needed for pain. 30 tablet 0   pregabalin (LYRICA) 75 MG capsule Take 75 mg by mouth 2 (two) times daily.     promethazine (PHENERGAN) 25  MG tablet Take 1 tablet (25 mg total) by mouth every 8 (eight) hours as needed. (Patient not taking: Reported on 07/02/2022) 20 tablet 0   ranitidine (ZANTAC) 150 MG tablet Take 150 mg by mouth 2 (two) times daily. (Patient not taking: Reported on 07/02/2022)     triamcinolone ointment (KENALOG) 0.1 %  (Patient not taking: Reported on 07/02/2022)     Current Facility-Administered Medications  Medication Dose Route Frequency Provider Last Rate Last Admin   0.9 %  sodium chloride infusion  500 mL Intravenous Once Meryl Dare, MD        Allergies as of 07/23/2022 -  Review Complete 07/02/2022  Allergen Reaction Noted   Latex Anaphylaxis 07/22/2021   Dog epithelium (canis lupus familiaris) Other (See Comments) 07/22/2021   Dust mite extract Other (See Comments) 07/22/2021   Molds & smuts Other (See Comments) 07/22/2021   Pollen extract Other (See Comments) 07/22/2021    Family History  Problem Relation Age of Onset   Colon cancer Neg Hx    Esophageal cancer Neg Hx    Stomach cancer Neg Hx    Rectal cancer Neg Hx    Colon polyps Neg Hx     Social History   Socioeconomic History   Marital status: Single    Spouse name: Not on file   Number of children: Not on file   Years of education: Not on file   Highest education level: Not on file  Occupational History   Not on file  Tobacco Use   Smoking status: Never   Smokeless tobacco: Never  Substance and Sexual Activity   Alcohol use: Yes    Comment: occasional wine   Drug use: No   Sexual activity: Not on file  Other Topics Concern   Not on file  Social History Narrative   Not on file   Social Determinants of Health   Financial Resource Strain: Not on file  Food Insecurity: Not on file  Transportation Needs: Not on file  Physical Activity: Not on file  Stress: Not on file  Social Connections: Not on file  Intimate Partner Violence: Not on file    Review of Systems:  All systems reviewed were negative except where noted in HPI.   Physical Exam:  General:  Alert, well-developed, in NAD Head:  Normocephalic and atraumatic. Eyes:  Sclera clear, no icterus.   Conjunctiva pink. Ears:  Normal auditory acuity. Mouth:  No deformity or lesions.  Neck:  Supple; no masses. Lungs:  Clear throughout to auscultation.   No wheezes, crackles, or rhonchi.  Heart:  Regular rate and rhythm; no murmurs. Abdomen:  Soft, nondistended, nontender. No masses, hepatomegaly. No palpable masses.  Normal bowel sounds.    Rectal:  Deferred   Msk:  Symmetrical without gross  deformities. Extremities:  Without edema. Neurologic:  Alert and  oriented x 4; grossly normal neurologically. Skin:  Intact without significant lesions or rashes. Psych:  Alert and cooperative. Normal mood and affect.   Venita Lick. Russella Dar  07/23/2022, 3:13 PM See Loretha Stapler,  GI, to contact our on call provider

## 2022-07-23 NOTE — Patient Instructions (Signed)

## 2022-07-23 NOTE — Progress Notes (Signed)
Cell phone off per pt  ? ?Pt's states no medical or surgical changes since previsit or office visit. ? ?

## 2022-07-24 ENCOUNTER — Telehealth: Payer: Self-pay | Admitting: *Deleted

## 2022-07-24 NOTE — Telephone Encounter (Signed)
  Follow up Call-     07/23/2022    3:08 PM  Call back number  Post procedure Call Back phone  # [phone]  Permission to leave phone message Yes     Patient questions:  Do you have a fever, pain , or abdominal swelling? No. Pain Score  0 *  Have you tolerated food without any problems? Yes.    Have you been able to return to your normal activities? Yes.    Do you have any questions about your discharge instructions: Diet   No. Medications  No. Follow up visit  No.  Do you have questions or concerns about your Care? No.  Actions: * If pain score is 4 or above: No action needed, pain <4.

## 2022-08-11 ENCOUNTER — Encounter: Payer: Self-pay | Admitting: Gastroenterology

## 2022-08-17 DIAGNOSIS — N950 Postmenopausal bleeding: Secondary | ICD-10-CM | POA: Insufficient documentation

## 2023-03-04 DIAGNOSIS — G8929 Other chronic pain: Secondary | ICD-10-CM | POA: Diagnosis not present

## 2023-03-04 DIAGNOSIS — M48062 Spinal stenosis, lumbar region with neurogenic claudication: Secondary | ICD-10-CM | POA: Diagnosis not present

## 2023-03-04 DIAGNOSIS — M5416 Radiculopathy, lumbar region: Secondary | ICD-10-CM | POA: Diagnosis not present

## 2023-03-04 DIAGNOSIS — M5451 Vertebrogenic low back pain: Secondary | ICD-10-CM | POA: Diagnosis not present

## 2023-04-27 DIAGNOSIS — J3089 Other allergic rhinitis: Secondary | ICD-10-CM | POA: Diagnosis not present

## 2023-04-27 DIAGNOSIS — H1045 Other chronic allergic conjunctivitis: Secondary | ICD-10-CM | POA: Diagnosis not present

## 2023-04-27 DIAGNOSIS — L501 Idiopathic urticaria: Secondary | ICD-10-CM | POA: Diagnosis not present

## 2023-05-10 DIAGNOSIS — M792 Neuralgia and neuritis, unspecified: Secondary | ICD-10-CM | POA: Diagnosis not present

## 2023-05-10 DIAGNOSIS — M205X2 Other deformities of toe(s) (acquired), left foot: Secondary | ICD-10-CM | POA: Diagnosis not present

## 2023-05-10 DIAGNOSIS — M10372 Gout due to renal impairment, left ankle and foot: Secondary | ICD-10-CM | POA: Diagnosis not present

## 2023-05-17 DIAGNOSIS — M792 Neuralgia and neuritis, unspecified: Secondary | ICD-10-CM | POA: Diagnosis not present

## 2023-05-17 DIAGNOSIS — M10372 Gout due to renal impairment, left ankle and foot: Secondary | ICD-10-CM | POA: Diagnosis not present

## 2023-05-17 DIAGNOSIS — R52 Pain, unspecified: Secondary | ICD-10-CM | POA: Diagnosis not present

## 2023-06-01 DIAGNOSIS — M48062 Spinal stenosis, lumbar region with neurogenic claudication: Secondary | ICD-10-CM | POA: Diagnosis not present

## 2023-06-01 DIAGNOSIS — M5416 Radiculopathy, lumbar region: Secondary | ICD-10-CM | POA: Diagnosis not present

## 2023-06-01 DIAGNOSIS — G8929 Other chronic pain: Secondary | ICD-10-CM | POA: Diagnosis not present

## 2023-06-01 DIAGNOSIS — M5451 Vertebrogenic low back pain: Secondary | ICD-10-CM | POA: Diagnosis not present

## 2023-06-09 DIAGNOSIS — N1831 Chronic kidney disease, stage 3a: Secondary | ICD-10-CM | POA: Diagnosis not present

## 2023-06-09 DIAGNOSIS — E6609 Other obesity due to excess calories: Secondary | ICD-10-CM | POA: Diagnosis not present

## 2023-06-09 DIAGNOSIS — I129 Hypertensive chronic kidney disease with stage 1 through stage 4 chronic kidney disease, or unspecified chronic kidney disease: Secondary | ICD-10-CM | POA: Diagnosis not present

## 2023-06-09 DIAGNOSIS — M5416 Radiculopathy, lumbar region: Secondary | ICD-10-CM | POA: Diagnosis not present

## 2023-06-14 DIAGNOSIS — R52 Pain, unspecified: Secondary | ICD-10-CM | POA: Diagnosis not present

## 2023-06-14 DIAGNOSIS — M792 Neuralgia and neuritis, unspecified: Secondary | ICD-10-CM | POA: Diagnosis not present

## 2023-06-14 DIAGNOSIS — M10372 Gout due to renal impairment, left ankle and foot: Secondary | ICD-10-CM | POA: Diagnosis not present

## 2023-06-16 DIAGNOSIS — N1831 Chronic kidney disease, stage 3a: Secondary | ICD-10-CM | POA: Diagnosis not present

## 2023-06-16 DIAGNOSIS — I129 Hypertensive chronic kidney disease with stage 1 through stage 4 chronic kidney disease, or unspecified chronic kidney disease: Secondary | ICD-10-CM | POA: Diagnosis not present

## 2023-06-16 DIAGNOSIS — E6609 Other obesity due to excess calories: Secondary | ICD-10-CM | POA: Diagnosis not present

## 2023-06-16 DIAGNOSIS — M5416 Radiculopathy, lumbar region: Secondary | ICD-10-CM | POA: Diagnosis not present

## 2023-06-17 DIAGNOSIS — M5416 Radiculopathy, lumbar region: Secondary | ICD-10-CM | POA: Diagnosis not present

## 2023-07-01 ENCOUNTER — Encounter: Payer: Self-pay | Admitting: Podiatry

## 2023-07-01 ENCOUNTER — Ambulatory Visit: Admitting: Podiatry

## 2023-07-01 ENCOUNTER — Ambulatory Visit (INDEPENDENT_AMBULATORY_CARE_PROVIDER_SITE_OTHER)

## 2023-07-01 DIAGNOSIS — M2012 Hallux valgus (acquired), left foot: Secondary | ICD-10-CM | POA: Diagnosis not present

## 2023-07-01 DIAGNOSIS — M109 Gout, unspecified: Secondary | ICD-10-CM | POA: Diagnosis not present

## 2023-07-01 MED ORDER — COLCHICINE 0.6 MG PO TABS: 0.6000 mg | ORAL_TABLET | Freq: Every day | ORAL | 3 refills | Status: AC

## 2023-07-01 MED ORDER — COLCHICINE 0.6 MG PO TABS: 0.6000 mg | ORAL_TABLET | Freq: Every day | ORAL | 3 refills | Status: DC

## 2023-07-05 NOTE — Progress Notes (Signed)
 Subjective:  Patient ID: Carla Owens, female    DOB: [birth year],  MRN: [id number] HPI Chief Complaint  Patient presents with   Bunions    Rm8 Left foot bunion/red,swollen,tingling with constant pain/symptoms started 2 months ago/ NSAIDS and cam walker from urgent care.    68 y.o. female presents with the above complaint.   ROS: Denies fever chills nausea mobic muscle aches pains calf pain back pain chest pain shortness of breath.  Past Medical History:  Diagnosis Date   Hypertension    Past Surgical History:  Procedure Laterality Date   COLONOSCOPY     2008    Current Outpatient Medications:    colchicine  0.6 MG tablet, Take by mouth., Disp: , Rfl:    Doxepin HCl 3 MG TABS, Take 1 tablet by mouth at bedtime., Disp: , Rfl:    fluticasone (FLONASE) 50 MCG/ACT nasal spray, , Disp: , Rfl:    hydrOXYzine (ATARAX/VISTARIL) 25 MG tablet, Take 25 mg by mouth at bedtime., Disp: , Rfl:    ibuprofen (ADVIL,MOTRIN) 800 MG tablet, Take 800 mg by mouth every 8 (eight) hours as needed., Disp: , Rfl:    irbesartan-hydrochlorothiazide (AVALIDE) 150-12.5 MG tablet, , Disp: , Rfl:    levocetirizine (XYZAL) 5 MG tablet, , Disp: , Rfl:    methylPREDNISolone  (MEDROL  DOSEPAK) 4 MG TBPK tablet, Take by mouth as directed., Disp: , Rfl:    oxyCODONE -acetaminophen  (PERCOCET) 10-325 MG tablet, Take 1 tablet by mouth every 6 (six) hours as needed for pain., Disp: 30 tablet, Rfl: 0   pregabalin (LYRICA) 75 MG capsule, Take 75 mg by mouth 2 (two) times daily., Disp: , Rfl:    valACYclovir (VALTREX) 500 MG tablet, Take by mouth., Disp: , Rfl:    cephALEXin  (KEFLEX ) 500 MG capsule, Take 1 capsule (500 mg total) by mouth 3 (three) times daily. (Patient not taking: Reported on 07/01/2023), Disp: 30 capsule, Rfl: 0   colchicine  0.6 MG tablet, Take 1 tablet (0.6 mg total) by mouth daily., Disp: 90 tablet, Rfl: 3   EPINEPHrine 0.3 mg/0.3 mL IJ SOAJ injection, SMARTSIG:1 pre-filled pen syringe IM Once, Disp: , Rfl:     NEOMYCIN -POLYMYXIN-HYDROCORTISONE (CORTISPORIN) 1 % SOLN otic solution, Apply 1-2 drops to toe BID after soaking (Patient not taking: Reported on 07/01/2023), Disp: 10 mL, Rfl: 1   nystatin ointment (MYCOSTATIN), , Disp: , Rfl:    Olopatadine HCl 0.2 % SOLN, 1 drop ou Ophthalmic Once a day for 30 days, Disp: , Rfl:    promethazine  (PHENERGAN ) 25 MG tablet, Take 1 tablet (25 mg total) by mouth every 8 (eight) hours as needed. (Patient not taking: Reported on 07/01/2023), Disp: 20 tablet, Rfl: 0   ranitidine (ZANTAC) 150 MG tablet, Take 150 mg by mouth 2 (two) times daily. (Patient not taking: Reported on 07/01/2023), Disp: , Rfl:    triamcinolone  ointment (KENALOG ) 0.1 %, , Disp: , Rfl:   Allergies  Allergen Reactions   Latex Anaphylaxis   Dog Epithelium (Canis Lupus Familiaris) Other (See Comments)   Dust Mite Extract Other (See Comments)   Molds & Smuts Other (See Comments)   Pollen Extract Other (See Comments)   Review of Systems Objective:  There were no vitals filed for this visit.  General: Well developed, nourished, in no acute distress, alert and oriented x3   Dermatological: Skin is warm, dry and supple bilateral. Nails x 10 are well maintained; remaining integument appears unremarkable at this time. There are no open sores, no preulcerative lesions, no rash  or signs of infection present.  Vascular: Dorsalis Pedis artery and Posterior Tibial artery pedal pulses are 2/4 bilateral with immedate capillary fill time. Pedal hair growth present. No varicosities and no lower extremity edema present bilateral.   Neruologic: Grossly intact via light touch bilateral. Vibratory intact via tuning fork bilateral. Protective threshold with Semmes Wienstein monofilament intact to all pedal sites bilateral. Patellar and Achilles deep tendon reflexes 2+ bilateral. No Babinski or clonus noted bilateral.   Musculoskeletal: No gross boney pedal deformities bilateral. No pain, crepitus, or limitation noted  with foot and ankle range of motion bilateral. Muscular strength 5/5 in all groups tested bilateral.  She has mild warmth on palpation of the first metatarsal phalangeal joint with mild erythema and some tenderness on range of motion.  There is some mild edema around the joint.  Gait: Unassisted, Nonantalgic.    Radiographs:  Radiographs taken today demonstrate an osseously mature individual with good bone mineralization.  She does have some swelling around the rectus first metatarsal phalangeal joint.  No other acute findings are identifiable.  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  Plan: Discussed etiology pathology conservative versus surgical therapies.  At this point were going to treated as if it were gout.  We are going to put her on colchicine  0.6 mg which she can take daily unless she has a flare.  With the flare she will take 0.6 mg 1 tablet every 3 hours no more than 3 tablets in a day.  I will follow-up with her in about 2 months for blood work.     Deshayla Empson T. [place], [place]

## 2023-07-08 DIAGNOSIS — M533 Sacrococcygeal disorders, not elsewhere classified: Secondary | ICD-10-CM | POA: Diagnosis not present

## 2023-07-08 DIAGNOSIS — G8929 Other chronic pain: Secondary | ICD-10-CM | POA: Diagnosis not present

## 2023-07-08 DIAGNOSIS — M48062 Spinal stenosis, lumbar region with neurogenic claudication: Secondary | ICD-10-CM | POA: Diagnosis not present

## 2023-07-08 DIAGNOSIS — M5416 Radiculopathy, lumbar region: Secondary | ICD-10-CM | POA: Diagnosis not present

## 2023-07-08 DIAGNOSIS — M5451 Vertebrogenic low back pain: Secondary | ICD-10-CM | POA: Diagnosis not present

## 2023-07-21 DIAGNOSIS — M533 Sacrococcygeal disorders, not elsewhere classified: Secondary | ICD-10-CM | POA: Diagnosis not present

## 2023-08-03 DIAGNOSIS — M48062 Spinal stenosis, lumbar region with neurogenic claudication: Secondary | ICD-10-CM | POA: Diagnosis not present

## 2023-08-03 DIAGNOSIS — M533 Sacrococcygeal disorders, not elsewhere classified: Secondary | ICD-10-CM | POA: Diagnosis not present

## 2023-08-03 DIAGNOSIS — M5451 Vertebrogenic low back pain: Secondary | ICD-10-CM | POA: Diagnosis not present

## 2023-08-03 DIAGNOSIS — G8929 Other chronic pain: Secondary | ICD-10-CM | POA: Diagnosis not present

## 2023-08-03 DIAGNOSIS — M5416 Radiculopathy, lumbar region: Secondary | ICD-10-CM | POA: Diagnosis not present

## 2023-08-04 ENCOUNTER — Other Ambulatory Visit: Payer: Self-pay | Admitting: Nurse Practitioner

## 2023-08-04 DIAGNOSIS — M48062 Spinal stenosis, lumbar region with neurogenic claudication: Secondary | ICD-10-CM

## 2023-08-12 ENCOUNTER — Other Ambulatory Visit: Payer: Self-pay

## 2023-08-13 ENCOUNTER — Ambulatory Visit
Admission: RE | Admit: 2023-08-13 | Discharge: 2023-08-13 | Disposition: A | Discharge status: Home / Self Care | Payer: Self-pay | Source: Ambulatory Visit | Attending: Nurse Practitioner | Admitting: Nurse Practitioner

## 2023-08-13 DIAGNOSIS — M48061 Spinal stenosis, lumbar region without neurogenic claudication: Secondary | ICD-10-CM | POA: Diagnosis not present

## 2023-08-13 DIAGNOSIS — M48062 Spinal stenosis, lumbar region with neurogenic claudication: Secondary | ICD-10-CM

## 2023-08-20 DIAGNOSIS — R92342 Mammographic extreme density, left breast: Secondary | ICD-10-CM | POA: Diagnosis not present

## 2023-08-20 DIAGNOSIS — N6452 Nipple discharge: Secondary | ICD-10-CM | POA: Diagnosis not present

## 2023-09-02 ENCOUNTER — Ambulatory Visit: Admitting: Podiatry

## 2023-09-09 DIAGNOSIS — Z1331 Encounter for screening for depression: Secondary | ICD-10-CM | POA: Diagnosis not present

## 2023-09-09 DIAGNOSIS — Z124 Encounter for screening for malignant neoplasm of cervix: Secondary | ICD-10-CM | POA: Diagnosis not present

## 2023-09-09 DIAGNOSIS — Z01411 Encounter for gynecological examination (general) (routine) with abnormal findings: Secondary | ICD-10-CM | POA: Diagnosis not present

## 2023-09-09 DIAGNOSIS — Z01419 Encounter for gynecological examination (general) (routine) without abnormal findings: Secondary | ICD-10-CM | POA: Diagnosis not present

## 2023-09-16 ENCOUNTER — Encounter: Payer: Self-pay | Admitting: Podiatry

## 2023-09-16 ENCOUNTER — Ambulatory Visit: Admitting: Podiatry

## 2023-09-16 DIAGNOSIS — J309 Allergic rhinitis, unspecified: Secondary | ICD-10-CM | POA: Insufficient documentation

## 2023-09-16 DIAGNOSIS — M19072 Primary osteoarthritis, left ankle and foot: Secondary | ICD-10-CM

## 2023-09-16 DIAGNOSIS — L501 Idiopathic urticaria: Secondary | ICD-10-CM | POA: Insufficient documentation

## 2023-09-16 DIAGNOSIS — H1045 Other chronic allergic conjunctivitis: Secondary | ICD-10-CM | POA: Insufficient documentation

## 2023-09-16 DIAGNOSIS — M109 Gout, unspecified: Secondary | ICD-10-CM

## 2023-09-16 MED ORDER — METHYLPREDNISOLONE 4 MG PO TBPK
ORAL_TABLET | ORAL | 0 refills | Status: DC
Start: 2023-09-16 — End: 2023-11-04

## 2023-09-16 MED ORDER — TRIAMCINOLONE ACETONIDE 40 MG/ML IJ SUSP
20.0000 mg | Freq: Once | INTRAMUSCULAR | Status: AC
Start: 2023-09-16 — End: 2023-09-16
  Administered 2023-09-16: 20 mg

## 2023-09-16 NOTE — Progress Notes (Signed)
 She presents today for follow-up of her gout left foot.  States that she still having some pain but she continues to take her colchicine  on a regular basis.  She is also complaining of pain in the bilateral ankles and the left heel primarily.  Objective: Vital signs stable alert oriented x 3 no reproducible pain on palpation of the ankles though she does have some soft tissue swelling.  She has pain on palpation medial Vacuant tubercle of the left heel.  Some tenderness on range of motion of the first metatarsophalangeal joint left.  Assessment: Plan fasciitis and gouty arthritis.  Plan: Discussed etiology pathology conservative surgical therapies I would like to do a arthritic profile at this point just to evaluate for seropositive arthropathies and to evaluate for gout situation uric acid levels.  Started on methylprednisolone .  And I will follow-up with her in 1 month or if her blood work comes back abnormal.

## 2023-09-20 LAB — COMPREHENSIVE METABOLIC PANEL WITH GFR
AG Ratio: 1.8 (calc) (ref 1.0–2.5)
ALT: 15 U/L (ref 6–29)
AST: 17 U/L (ref 10–35)
Albumin: 4.2 g/dL (ref 3.6–5.1)
Alkaline phosphatase (APISO): 70 U/L (ref 37–153)
BUN/Creatinine Ratio: 21 (calc) (ref 6–22)
BUN: 29 mg/dL — ABNORMAL HIGH (ref 7–25)
CO2: 27 mmol/L (ref 20–32)
Calcium: 9.8 mg/dL (ref 8.6–10.4)
Chloride: 106 mmol/L (ref 98–110)
Creat: 1.39 mg/dL — ABNORMAL HIGH (ref 0.50–1.05)
Globulin: 2.3 g/dL (ref 1.9–3.7)
Glucose, Bld: 91 mg/dL (ref 65–139)
Potassium: 4.2 mmol/L (ref 3.5–5.3)
Sodium: 143 mmol/L (ref 135–146)
Total Bilirubin: 0.4 mg/dL (ref 0.2–1.2)
Total Protein: 6.5 g/dL (ref 6.1–8.1)
eGFR: 42 mL/min/1.73m2 — ABNORMAL LOW (ref >=60)

## 2023-09-20 LAB — CBC WITH DIFFERENTIAL/PLATELET
Absolute Lymphocytes: 2475 {cells}/uL (ref 850–3900)
Absolute Monocytes: 403 {cells}/uL (ref 200–950)
Basophils Absolute: 50 {cells}/uL (ref 0–200)
Basophils Relative: 0.9 %
Eosinophils Absolute: 129 {cells}/uL (ref 15–500)
Eosinophils Relative: 2.3 %
HCT: 44 % (ref 35.0–45.0)
Hemoglobin: 13.9 g/dL (ref 11.7–15.5)
MCH: 25.4 pg — ABNORMAL LOW (ref 27.0–33.0)
MCHC: 31.6 g/dL — ABNORMAL LOW (ref 32.0–36.0)
MCV: 80.4 fL (ref 80.0–100.0)
MPV: 13 fL — ABNORMAL HIGH (ref 7.5–12.5)
Monocytes Relative: 7.2 %
Neutro Abs: 2542 {cells}/uL (ref 1500–7800)
Neutrophils Relative %: 45.4 %
Platelets: 185 Thousand/uL (ref 140–400)
RBC: 5.47 Million/uL — ABNORMAL HIGH (ref 3.80–5.10)
RDW: 16 % — ABNORMAL HIGH (ref 11.0–15.0)
Total Lymphocyte: 44.2 %
WBC: 5.6 Thousand/uL (ref 3.8–10.8)

## 2023-09-20 LAB — ANTI-NUCLEAR AB-TITER (ANA TITER): ANA Titer 1: 1:160 {titer} — ABNORMAL HIGH

## 2023-09-20 LAB — RHEUMATOID FACTOR: Rheumatoid fact SerPl-aCnc: <10 [IU]/mL (ref <14)

## 2023-09-20 LAB — URIC ACID: Uric Acid, Serum: 9.3 mg/dL — ABNORMAL HIGH (ref 2.5–7.0)

## 2023-09-20 LAB — C-REACTIVE PROTEIN: CRP: 3.3 mg/L (ref <8.0)

## 2023-09-20 LAB — SEDIMENTATION RATE: Sed Rate: 6 mm/h (ref 0–30)

## 2023-09-20 LAB — ANA: Anti Nuclear Antibody (ANA): POSITIVE — AB

## 2023-09-22 DIAGNOSIS — M533 Sacrococcygeal disorders, not elsewhere classified: Secondary | ICD-10-CM | POA: Diagnosis not present

## 2023-09-22 DIAGNOSIS — M5416 Radiculopathy, lumbar region: Secondary | ICD-10-CM | POA: Diagnosis not present

## 2023-09-22 DIAGNOSIS — M5451 Vertebrogenic low back pain: Secondary | ICD-10-CM | POA: Diagnosis not present

## 2023-09-22 DIAGNOSIS — M48062 Spinal stenosis, lumbar region with neurogenic claudication: Secondary | ICD-10-CM | POA: Diagnosis not present

## 2023-09-22 DIAGNOSIS — G8929 Other chronic pain: Secondary | ICD-10-CM | POA: Diagnosis not present

## 2023-09-23 ENCOUNTER — Ambulatory Visit: Payer: Self-pay | Admitting: Podiatry

## 2023-09-23 DIAGNOSIS — M8588 Other specified disorders of bone density and structure, other site: Secondary | ICD-10-CM | POA: Diagnosis not present

## 2023-09-23 DIAGNOSIS — M8589 Other specified disorders of bone density and structure, multiple sites: Secondary | ICD-10-CM | POA: Diagnosis not present

## 2023-09-23 DIAGNOSIS — E2839 Other primary ovarian failure: Secondary | ICD-10-CM | POA: Diagnosis not present

## 2023-09-23 DIAGNOSIS — M85851 Other specified disorders of bone density and structure, right thigh: Secondary | ICD-10-CM | POA: Diagnosis not present

## 2023-10-01 DIAGNOSIS — M6281 Muscle weakness (generalized): Secondary | ICD-10-CM | POA: Diagnosis not present

## 2023-10-01 DIAGNOSIS — M47896 Other spondylosis, lumbar region: Secondary | ICD-10-CM | POA: Diagnosis not present

## 2023-10-01 DIAGNOSIS — M4807 Spinal stenosis, lumbosacral region: Secondary | ICD-10-CM | POA: Diagnosis not present

## 2023-10-01 DIAGNOSIS — M5459 Other low back pain: Secondary | ICD-10-CM | POA: Diagnosis not present

## 2023-10-07 DIAGNOSIS — M4807 Spinal stenosis, lumbosacral region: Secondary | ICD-10-CM | POA: Diagnosis not present

## 2023-10-07 DIAGNOSIS — M47896 Other spondylosis, lumbar region: Secondary | ICD-10-CM | POA: Diagnosis not present

## 2023-10-07 DIAGNOSIS — M5459 Other low back pain: Secondary | ICD-10-CM | POA: Diagnosis not present

## 2023-10-07 DIAGNOSIS — M6281 Muscle weakness (generalized): Secondary | ICD-10-CM | POA: Diagnosis not present

## 2023-10-12 DIAGNOSIS — M47896 Other spondylosis, lumbar region: Secondary | ICD-10-CM | POA: Diagnosis not present

## 2023-10-12 DIAGNOSIS — M6281 Muscle weakness (generalized): Secondary | ICD-10-CM | POA: Diagnosis not present

## 2023-10-12 DIAGNOSIS — M5459 Other low back pain: Secondary | ICD-10-CM | POA: Diagnosis not present

## 2023-10-12 DIAGNOSIS — M4807 Spinal stenosis, lumbosacral region: Secondary | ICD-10-CM | POA: Diagnosis not present

## 2023-10-14 DIAGNOSIS — M6281 Muscle weakness (generalized): Secondary | ICD-10-CM | POA: Diagnosis not present

## 2023-10-14 DIAGNOSIS — M4807 Spinal stenosis, lumbosacral region: Secondary | ICD-10-CM | POA: Diagnosis not present

## 2023-10-14 DIAGNOSIS — M5459 Other low back pain: Secondary | ICD-10-CM | POA: Diagnosis not present

## 2023-10-14 DIAGNOSIS — M47896 Other spondylosis, lumbar region: Secondary | ICD-10-CM | POA: Diagnosis not present

## 2023-10-20 DIAGNOSIS — M533 Sacrococcygeal disorders, not elsewhere classified: Secondary | ICD-10-CM | POA: Diagnosis not present

## 2023-10-20 DIAGNOSIS — M5451 Vertebrogenic low back pain: Secondary | ICD-10-CM | POA: Diagnosis not present

## 2023-10-20 DIAGNOSIS — G8929 Other chronic pain: Secondary | ICD-10-CM | POA: Diagnosis not present

## 2023-10-20 DIAGNOSIS — M48062 Spinal stenosis, lumbar region with neurogenic claudication: Secondary | ICD-10-CM | POA: Diagnosis not present

## 2023-10-20 DIAGNOSIS — M5416 Radiculopathy, lumbar region: Secondary | ICD-10-CM | POA: Diagnosis not present

## 2023-10-21 DIAGNOSIS — Z23 Encounter for immunization: Secondary | ICD-10-CM | POA: Diagnosis not present

## 2023-10-22 DIAGNOSIS — M47896 Other spondylosis, lumbar region: Secondary | ICD-10-CM | POA: Diagnosis not present

## 2023-10-22 DIAGNOSIS — M6281 Muscle weakness (generalized): Secondary | ICD-10-CM | POA: Diagnosis not present

## 2023-10-22 DIAGNOSIS — M4807 Spinal stenosis, lumbosacral region: Secondary | ICD-10-CM | POA: Diagnosis not present

## 2023-10-22 DIAGNOSIS — M5459 Other low back pain: Secondary | ICD-10-CM | POA: Diagnosis not present

## 2023-10-25 DIAGNOSIS — M47896 Other spondylosis, lumbar region: Secondary | ICD-10-CM | POA: Diagnosis not present

## 2023-10-25 DIAGNOSIS — M6281 Muscle weakness (generalized): Secondary | ICD-10-CM | POA: Diagnosis not present

## 2023-10-25 DIAGNOSIS — M4807 Spinal stenosis, lumbosacral region: Secondary | ICD-10-CM | POA: Diagnosis not present

## 2023-10-25 DIAGNOSIS — M5459 Other low back pain: Secondary | ICD-10-CM | POA: Diagnosis not present

## 2023-10-26 ENCOUNTER — Ambulatory Visit: Admitting: Podiatry

## 2023-10-29 DIAGNOSIS — M47896 Other spondylosis, lumbar region: Secondary | ICD-10-CM | POA: Diagnosis not present

## 2023-10-29 DIAGNOSIS — M5459 Other low back pain: Secondary | ICD-10-CM | POA: Diagnosis not present

## 2023-10-29 DIAGNOSIS — M4807 Spinal stenosis, lumbosacral region: Secondary | ICD-10-CM | POA: Diagnosis not present

## 2023-10-29 DIAGNOSIS — M6281 Muscle weakness (generalized): Secondary | ICD-10-CM | POA: Diagnosis not present

## 2023-11-02 DIAGNOSIS — M6281 Muscle weakness (generalized): Secondary | ICD-10-CM | POA: Diagnosis not present

## 2023-11-02 DIAGNOSIS — M5459 Other low back pain: Secondary | ICD-10-CM | POA: Diagnosis not present

## 2023-11-02 DIAGNOSIS — M47896 Other spondylosis, lumbar region: Secondary | ICD-10-CM | POA: Diagnosis not present

## 2023-11-02 DIAGNOSIS — M4807 Spinal stenosis, lumbosacral region: Secondary | ICD-10-CM | POA: Diagnosis not present

## 2023-11-04 ENCOUNTER — Ambulatory Visit: Admitting: Podiatry

## 2023-11-04 DIAGNOSIS — M722 Plantar fascial fibromatosis: Secondary | ICD-10-CM | POA: Diagnosis not present

## 2023-11-04 DIAGNOSIS — M19072 Primary osteoarthritis, left ankle and foot: Secondary | ICD-10-CM | POA: Diagnosis not present

## 2023-11-04 DIAGNOSIS — R7689 Other specified abnormal immunological findings in serum: Secondary | ICD-10-CM | POA: Diagnosis not present

## 2023-11-04 DIAGNOSIS — E790 Hyperuricemia without signs of inflammatory arthritis and tophaceous disease: Secondary | ICD-10-CM | POA: Diagnosis not present

## 2023-11-04 MED ORDER — TRIAMCINOLONE ACETONIDE 40 MG/ML IJ SUSP
40.0000 mg | Freq: Once | INTRAMUSCULAR | Status: AC
  Administered 2023-11-04: 40 mg

## 2023-11-05 DIAGNOSIS — M6281 Muscle weakness (generalized): Secondary | ICD-10-CM | POA: Diagnosis not present

## 2023-11-05 DIAGNOSIS — M4807 Spinal stenosis, lumbosacral region: Secondary | ICD-10-CM | POA: Diagnosis not present

## 2023-11-05 DIAGNOSIS — M5459 Other low back pain: Secondary | ICD-10-CM | POA: Diagnosis not present

## 2023-11-05 DIAGNOSIS — M47896 Other spondylosis, lumbar region: Secondary | ICD-10-CM | POA: Diagnosis not present

## 2023-11-05 NOTE — Progress Notes (Signed)
 She presents today with her aunt to go over her lab work.  She states that my feet feel tired today and they hurt in the heels.  Objective: Vital signs are stable she is alert and oriented x 3.  Pulses are palpable.  She has tenderness on palpation medial calcaneal tubercles bilateral.  We did go over the blood work which did demonstrate mildly elevated uric acid however I am more concerned with the highly elevated antinuclear antibodies.  Assessment: Plantar fasciitis positive ANA positive UA.  Plan: I injected the bilateral heels today 20 mg Kenalog  5 mg Marcaine point maximal tenderness.  We also made a referral to rheumatology.  She will notify us  if no one calls her within the next week.

## 2023-11-09 DIAGNOSIS — M4807 Spinal stenosis, lumbosacral region: Secondary | ICD-10-CM | POA: Diagnosis not present

## 2023-11-09 DIAGNOSIS — M6281 Muscle weakness (generalized): Secondary | ICD-10-CM | POA: Diagnosis not present

## 2023-11-09 DIAGNOSIS — M47896 Other spondylosis, lumbar region: Secondary | ICD-10-CM | POA: Diagnosis not present

## 2023-11-09 DIAGNOSIS — M5459 Other low back pain: Secondary | ICD-10-CM | POA: Diagnosis not present

## 2023-11-16 DIAGNOSIS — M47896 Other spondylosis, lumbar region: Secondary | ICD-10-CM | POA: Diagnosis not present

## 2023-11-16 DIAGNOSIS — M5459 Other low back pain: Secondary | ICD-10-CM | POA: Diagnosis not present

## 2023-11-16 DIAGNOSIS — M4807 Spinal stenosis, lumbosacral region: Secondary | ICD-10-CM | POA: Diagnosis not present

## 2023-11-16 DIAGNOSIS — M6281 Muscle weakness (generalized): Secondary | ICD-10-CM | POA: Diagnosis not present

## 2023-11-18 DIAGNOSIS — M5416 Radiculopathy, lumbar region: Secondary | ICD-10-CM | POA: Diagnosis not present

## 2023-11-19 DIAGNOSIS — Z1231 Encounter for screening mammogram for malignant neoplasm of breast: Secondary | ICD-10-CM | POA: Diagnosis not present

## 2023-11-30 DIAGNOSIS — M4807 Spinal stenosis, lumbosacral region: Secondary | ICD-10-CM | POA: Diagnosis not present

## 2023-11-30 DIAGNOSIS — M5459 Other low back pain: Secondary | ICD-10-CM | POA: Diagnosis not present

## 2023-11-30 DIAGNOSIS — M47896 Other spondylosis, lumbar region: Secondary | ICD-10-CM | POA: Diagnosis not present

## 2023-11-30 DIAGNOSIS — M6281 Muscle weakness (generalized): Secondary | ICD-10-CM | POA: Diagnosis not present

## 2023-12-02 DIAGNOSIS — M47896 Other spondylosis, lumbar region: Secondary | ICD-10-CM | POA: Diagnosis not present

## 2023-12-02 DIAGNOSIS — M4807 Spinal stenosis, lumbosacral region: Secondary | ICD-10-CM | POA: Diagnosis not present

## 2023-12-02 DIAGNOSIS — M6281 Muscle weakness (generalized): Secondary | ICD-10-CM | POA: Diagnosis not present

## 2023-12-02 DIAGNOSIS — M5459 Other low back pain: Secondary | ICD-10-CM | POA: Diagnosis not present

## 2023-12-07 DIAGNOSIS — M5459 Other low back pain: Secondary | ICD-10-CM | POA: Diagnosis not present

## 2023-12-07 DIAGNOSIS — M4807 Spinal stenosis, lumbosacral region: Secondary | ICD-10-CM | POA: Diagnosis not present

## 2023-12-07 DIAGNOSIS — M6281 Muscle weakness (generalized): Secondary | ICD-10-CM | POA: Diagnosis not present

## 2023-12-07 DIAGNOSIS — M47896 Other spondylosis, lumbar region: Secondary | ICD-10-CM | POA: Diagnosis not present

## 2023-12-08 DIAGNOSIS — M5451 Vertebrogenic low back pain: Secondary | ICD-10-CM | POA: Diagnosis not present

## 2023-12-08 DIAGNOSIS — M533 Sacrococcygeal disorders, not elsewhere classified: Secondary | ICD-10-CM | POA: Diagnosis not present

## 2023-12-08 DIAGNOSIS — M5416 Radiculopathy, lumbar region: Secondary | ICD-10-CM | POA: Diagnosis not present

## 2023-12-08 DIAGNOSIS — M48062 Spinal stenosis, lumbar region with neurogenic claudication: Secondary | ICD-10-CM | POA: Diagnosis not present

## 2023-12-08 DIAGNOSIS — G8929 Other chronic pain: Secondary | ICD-10-CM | POA: Diagnosis not present

## 2023-12-09 DIAGNOSIS — M5459 Other low back pain: Secondary | ICD-10-CM | POA: Diagnosis not present

## 2023-12-09 DIAGNOSIS — M6281 Muscle weakness (generalized): Secondary | ICD-10-CM | POA: Diagnosis not present

## 2023-12-09 DIAGNOSIS — M47896 Other spondylosis, lumbar region: Secondary | ICD-10-CM | POA: Diagnosis not present

## 2023-12-09 DIAGNOSIS — M4807 Spinal stenosis, lumbosacral region: Secondary | ICD-10-CM | POA: Diagnosis not present

## 2023-12-14 DIAGNOSIS — M4807 Spinal stenosis, lumbosacral region: Secondary | ICD-10-CM | POA: Diagnosis not present

## 2023-12-14 DIAGNOSIS — M47896 Other spondylosis, lumbar region: Secondary | ICD-10-CM | POA: Diagnosis not present

## 2023-12-14 DIAGNOSIS — M6281 Muscle weakness (generalized): Secondary | ICD-10-CM | POA: Diagnosis not present

## 2023-12-14 DIAGNOSIS — M5459 Other low back pain: Secondary | ICD-10-CM | POA: Diagnosis not present

## 2023-12-17 DIAGNOSIS — M47896 Other spondylosis, lumbar region: Secondary | ICD-10-CM | POA: Diagnosis not present

## 2023-12-17 DIAGNOSIS — M5459 Other low back pain: Secondary | ICD-10-CM | POA: Diagnosis not present

## 2023-12-17 DIAGNOSIS — M4807 Spinal stenosis, lumbosacral region: Secondary | ICD-10-CM | POA: Diagnosis not present

## 2023-12-17 DIAGNOSIS — M6281 Muscle weakness (generalized): Secondary | ICD-10-CM | POA: Diagnosis not present

## 2023-12-22 DIAGNOSIS — M6281 Muscle weakness (generalized): Secondary | ICD-10-CM | POA: Diagnosis not present

## 2023-12-22 DIAGNOSIS — M5416 Radiculopathy, lumbar region: Secondary | ICD-10-CM | POA: Diagnosis not present

## 2023-12-22 DIAGNOSIS — R5383 Other fatigue: Secondary | ICD-10-CM | POA: Diagnosis not present

## 2023-12-22 DIAGNOSIS — N1831 Chronic kidney disease, stage 3a: Secondary | ICD-10-CM | POA: Diagnosis not present

## 2023-12-22 DIAGNOSIS — M47896 Other spondylosis, lumbar region: Secondary | ICD-10-CM | POA: Diagnosis not present

## 2023-12-22 DIAGNOSIS — M4807 Spinal stenosis, lumbosacral region: Secondary | ICD-10-CM | POA: Diagnosis not present

## 2023-12-22 DIAGNOSIS — E6609 Other obesity due to excess calories: Secondary | ICD-10-CM | POA: Diagnosis not present

## 2023-12-22 DIAGNOSIS — M5459 Other low back pain: Secondary | ICD-10-CM | POA: Diagnosis not present

## 2024-01-06 DIAGNOSIS — M533 Sacrococcygeal disorders, not elsewhere classified: Secondary | ICD-10-CM | POA: Diagnosis not present

## 2024-01-06 DIAGNOSIS — M5416 Radiculopathy, lumbar region: Secondary | ICD-10-CM | POA: Diagnosis not present

## 2024-01-06 DIAGNOSIS — M5451 Vertebrogenic low back pain: Secondary | ICD-10-CM | POA: Diagnosis not present

## 2024-01-06 DIAGNOSIS — G8929 Other chronic pain: Secondary | ICD-10-CM | POA: Diagnosis not present

## 2024-01-06 DIAGNOSIS — M48062 Spinal stenosis, lumbar region with neurogenic claudication: Secondary | ICD-10-CM | POA: Diagnosis not present

## 2024-02-15 ENCOUNTER — Encounter

## 2024-05-18 ENCOUNTER — Ambulatory Visit
